# Patient Record
Sex: Male | Born: 1970 | Race: White | Hispanic: No
Health system: Midwestern US, Community
[De-identification: ages and names within clinical notes are randomized; demographics above are authoritative.]

## PROBLEM LIST (undated history)

## (undated) DIAGNOSIS — G2581 Restless legs syndrome: Secondary | ICD-10-CM

## (undated) DIAGNOSIS — N529 Male erectile dysfunction, unspecified: Secondary | ICD-10-CM

## (undated) DIAGNOSIS — I10 Essential (primary) hypertension: Secondary | ICD-10-CM

## (undated) DIAGNOSIS — I4891 Unspecified atrial fibrillation: Secondary | ICD-10-CM

## (undated) DIAGNOSIS — S46911A Strain of unspecified muscle, fascia and tendon at shoulder and upper arm level, right arm, initial encounter: Secondary | ICD-10-CM

## (undated) DIAGNOSIS — M25519 Pain in unspecified shoulder: Secondary | ICD-10-CM

## (undated) DIAGNOSIS — I480 Paroxysmal atrial fibrillation: Principal | ICD-10-CM

## (undated) HISTORY — PX: HERNIA REPAIR: SHX51

## (undated) HISTORY — PX: COLONOSCOPY: SHX174

## (undated) HISTORY — PX: TONSILLECTOMY AND ADENOIDECTOMY: SHX28

---

## 2011-04-30 NOTE — Discharge Instructions (Signed)
Abdominal Pain  Abdominal pain can be caused by many things. Your caregiver decides the seriousness of your pain by an examination and possibly blood tests and X-rays. Many cases can be observed and treated at home. Most abdominal pain is not caused by a disease and will probably improve without treatment. However, in many cases, more time must pass before a clear cause of the pain can be found. Before that point, it may not be known if you need more testing, or if hospitalization or surgery is needed.  HOME CARE INSTRUCTIONS   Do not take laxatives unless directed by your caregiver.    Take pain medicine only as directed by your caregiver.    Only take over-the-counter or prescription medicines for pain, discomfort, or fever as directed by your caregiver.    Try a clear liquid diet (broth, tea, or water) as ordered by your caregiver. Slowly move to a bland diet as tolerated.   SEEK IMMEDIATE MEDICAL CARE IF:   The pain does not go away.    You or your child has an oral temperature above 102 F (38.9 C), not controlled by medicine.    You keep throwing up (vomiting).    The pain is felt only in portions of the abdomen. Pain in the right side could possibly be appendicitis. In an adult, pain in the left lower portion of the abdomen could be colitis or diverticulitis.    You pass bloody or black tarry stools.   MAKE SURE YOU:   Understand these instructions.    Will watch your condition.    Will get help right away if you are not doing well or get worse.   Document Released: 10/12/2005 Document Re-Released: 01/06/2010  [facility] Patient Information 2012 [place].    Gastritis  (Vomiting, Upset Stomach)  Gastritis is an inflammation (the body's way of reacting to injury and/or infection) of the stomach. It is often caused by viral or bacterial (germ) infections. It can also be caused by chemicals (including alcohol) and medications. This illness may be associated with generalized malaise (feeling  tired, not well), cramps, and fever. The illness may last two to seven days. If symptoms of gastritis continue, gastroscopy (looking into the stomach with a telescope-like instrument), biopsy (taking tissue sample), and/or blood tests may be necessary to determine the cause. Antibiotics will not affect the illness unless there is a bacterial infection present. One common bacterial cause of gastritis is an organism known as H. Pylori. This can be treated with antibiotics. Other forms of gastritis are caused by too much acid in the stomach. They can be treated with medications such as H2 blockers and antacids. Home treatment is usually all that is needed. Young children will quickly become dehydrated (loss of body fluids) if vomiting and diarrhea are both present. Medications may be given to control nausea. Medications are usually not given for diarrhea unless especially bothersome. Some medications slow the removal of the virus from the gastrointestinal tract. This slows down the healing process.  HOME CARE INSTRUCTIONS FOR NAUSEA AND VOMITING   For adults: drink small amounts of fluids often. Drink at least 2 quarts a day. Take sips frequently. Do not drink large amounts of fluid at one time. This may worsen the nausea.    Only take over-the-counter or prescription medicines for pain, discomfort, or fever as directed by your caregiver.    Drink clear liquids only. Those are anything you can see through such as water, broth, or soft drinks.  Once you are keeping clear liquids down, you may start full liquids, soups, juices, and ice cream or sherbet. Slowly add bland (plain, not spicy) foods to your diet.   HOME CARE INSTRUCTIONS FOR DIARRHEA  Diarrhea can be caused by bacterial infections or a virus. Your condition should improve with time, rest, fluids, and/or anti-diarrheal medication.  Until your diarrhea is under control, you should drink clear liquids often in small amounts. Clear liquids include: water,  broth, jell-o water and weak tea.    Avoid:   Milk.    Fruits.    Tobacco.    Alcohol.    Extremely hot or cold fluids.    Too much intake of anything at one time.      When your diarrhea stops you may add the following foods which help the stool to become more formed:   Rice    Bananas    Apples without skin    Dry toast   Once these foods are tolerated you may add low-fat yogurt and low-fat cottage cheese. They will help to restore the normal bacterial balance in your bowel.  Wash your hands well to avoid spreading bacteria (germ) or virus.  SEEK IMMEDIATE MEDICAL CARE IF:   You are unable to keep fluids down.    Vomiting or diarrhea become persistent (have again and again).    Abdominal pain develops, or increases, or localizes. (Right sided pain can be appendicitis. Left sided pain in adults can be diverticulitis.)    You develop a high fever (an oral temperature above 102 F (38.9 C), or as your caregiver suggests) for over 3 days.    Diarrhea becomes excessive or contains blood or mucous.    You have excessive weakness, dizziness, fainting or extreme thirst.   MAKE SURE YOU:     Understand these instructions.    Will watch your condition.    Will get help right away if you are not doing well or get worse.   Document Released: 01/31/2003 Document Re-Released: 08/09/2009  [facility] Patient Information 2012 [place].

## 2011-04-30 NOTE — ED Provider Notes (Signed)
HPI Comments: Patient states that he has pain in the epigastric area, worse with palpation or pressure on the abdomen.  He states that he is nauseated but there is no change in appetite and he continues to eat the same amount.  He denies change in bowel movement.  This does not feel similar to his diverticulitis.  He denies alcohol but admits to drinking large amounts of caffeine.    Patient is a 40 y.o. male presenting with abdominal pain. The history is provided by the patient.   Abdominal Pain  The primary symptoms of the illness include abdominal pain and nausea. The primary symptoms of the illness do not include fever, fatigue, shortness of breath, vomiting, diarrhea or dysuria. The current episode started more than 2 days ago. The onset of the illness was gradual. The problem has been gradually worsening.   The patient has not had a change in bowel habit. Symptoms associated with the illness do not include chills, anorexia, diaphoresis, heartburn, constipation, urgency, frequency or back pain. Significant associated medical issues include diverticulitis.       Review of Systems   Constitutional: Negative for fever, chills, diaphoresis, appetite change and fatigue.   HENT: Negative for ear pain, sore throat and sinus pressure.    Eyes: Negative for pain, discharge and redness.   Respiratory: Negative for cough, shortness of breath and wheezing.    Cardiovascular: Negative for chest pain.   Gastrointestinal: Positive for nausea and abdominal pain. Negative for heartburn, vomiting, diarrhea, constipation, blood in stool, abdominal distention and anorexia.   Genitourinary: Negative for dysuria, urgency and frequency.   Musculoskeletal: Negative for back pain and arthralgias.   Skin: Negative for rash and wound.   Neurological: Negative for weakness and headaches.   Hematological: Negative for adenopathy.   [all other systems reviewed and are negative        Physical Exam   [nursing notereviewed.  Constitutional:  He is oriented to person, place, and time. He appears well-developed and well-nourished. No distress.   HENT:   Head: Normocephalic and atraumatic.   Eyes: Pupils are equal, round, and reactive to light.   Neck: Normal range of motion. Neck supple.   Cardiovascular: Normal rate, regular rhythm and normal heart sounds.    No murmur heard.  Pulmonary/Chest: Effort normal and breath sounds normal. No respiratory distress. He has no wheezes. He has no rales.   Abdominal: Soft. Bowel sounds are normal. He exhibits no distension and no mass. There is no tenderness. There is no rebound and no guarding.   Musculoskeletal: He exhibits no edema.   Neurological: He is alert and oriented to person, place, and time. No cranial nerve deficit. Coordination normal.   Skin: Skin is warm and dry. He is not diaphoretic. No pallor.       Procedures    MDM  23:00  Patient sleeping comfortably in the room.  --------------------------------------------- PAST HISTORY ---------------------------------------------  Past Medical History:  has a past medical history of Diverticulitis.    Past Surgical History:  has past surgical history that includes hernia repair (2008); Appendectomy; and Abdomen surgery.    Social History:  reports that he has never smoked. He does not have any smokeless tobacco history on file. He reports that he drinks alcohol. He reports that he does not use illicit drugs.    Family History: family history is not on file.     The patient's home medications have been reviewed.    Allergies: Review of patient's  allergies indicates no known allergies.    -------------------------------------------------- RESULTS -------------------------------------------------  Labs:  Results for orders placed during the hospital encounter of 04/30/11   CBC WITH AUTO DIFFERENTIAL       Component Value Range    WBC 13.5 (*) 4.5 - 11.5 (E9/L)    RBC 5.10  3.80 - 5.80 (E12/L)    Hemoglobin 14.9  12.5 - 16.5 (g/dL)    Hematocrit 16.1  09.6 -  54.0 (%)    MCV 84.4  80.0 - 99.9 (fL)    MCH 29.2  26.0 - 35.0 (pg)    MCHC 34.6 (*) 32.0 - 34.5 (%)    RDW 13.1  11.5 - 15.0 (fL)    Platelets 294  130 - 450 (E9/L)    MPV 7.8  7.0 - 12.0 (fL)    Absolute Neut # 10.99 (*) 1.80 - 7.30 (E9/L)    Absolute Lymph # 1.69  1.50 - 4.00 (E9/L)    Absolute Mono # 0.67  0.10 - 0.95 (E9/L)    Absolute Eos # 0.09  0.05 - 0.50 (E9/L)    Basophils Absolute 0.02  0.00 - 0.20 (E9/L)    Seg Neutrophils 82 (*) 43 - 80 (%)    Lymphocytes 13 (*) 20 - 42 (%)    Monocytes 5  2 - 12 (%)    Eosinophils 1  0 - 6 (%)    Basophils 0  0 - 2 (%)   COMPREHENSIVE METABOLIC PANEL       Component Value Range    Sodium 142  132 - 146 (mmol/L)    Potassium 3.9  3.5 - 5.5 (mmol/L)    Chloride 108  99 - 109 (mmol/L)    CO2 26  20 - 31 (mmol/L)    Glucose 85  70 - 110 (mg/dL)    BUN 12  6 - 20 (mg/dL)    Creatinine, Ser 0.9  0.7 - 1.3 (mg/dL)    Calcium 9.8  8.6 - 10.5 (mg/dL)    Total Protein 7.3  5.7 - 8.2 (g/dL)    Alb 4.4  3.2 - 4.8 (g/dL)    Alkaline Phosphatase 68  45 - 129 (U/L)    AST 30  0 - 33 (U/L)    Total Bilirubin 0.4  0.3 - 1.2 (mg/dL)    ALT 34  10 - 49 (U/L)   LIPASE       Component Value Range    Lipase 29  6 - 51 (U/L)   LACTIC ACID, PLASMA       Component Value Range    Lactic Acid 0.7  0.5 - 2.2 (mmol/L)   GFR CALCULATED       Component Value Range    Gfr Calculated >60  >=60 (ml/mn/1.73)       Radiology:  X-ray Abdomen Ap    04/30/2011  "" Exam Start [id number] Indication- Pain  Findings- AP supine view of the abdomen obtained. Bowel gas pattern  unremarkable. No abnormal intra-abdominal calcification or acute osseous abnormality.  IMPRESSION-  No focal abnormality.        Transcriptionist- [facility]   M.D.      Read ByAlinda Dooms   M.D.      Released By- Alinda Dooms   M.D.      Released Date Time- 04/30/11 2251  ------------------------------------------------------------------------------       ------------------------- NURSING NOTES AND VITALS REVIEWED  ---------------------------  The nursing notes within the ED encounter and  vital signs as below have been reviewed.   BP 113/69  Pulse 68  Temp(Src) 98.4 F (36.9 C) (Oral)  Resp 16  Ht 6' (1.829 m)  Wt 240 lb (108.863 kg)  BMI 32.55 kg/m2  SpO2 95%  Oxygen Saturation Interpretation: Normal      ------------------------------------------ PROGRESS NOTES ------------------------------------------  I have spoken with the patient and discussed today's results, in addition to providing specific details for the plan of care and counseling regarding the diagnosis and prognosis.  Their questions are answered at this time and they are agreeable with the plan.  12:09  Patient feeling better after GI cocktail.  He was encouraged to cut back on caffeine.    --------------------------------- ADDITIONAL PROVIDER NOTES ---------------------------------  At this time the patient is without objective evidence of an acute process requiring hospitalization or inpatient management.  They have remained hemodynamically stable throughout their entire ED visit and are stable for discharge with outpatient follow-up.     The plan has been discussed in detail and they are aware of the specific conditions for emergent return, as well as the importance of follow-up.      New Prescriptions    FAMOTIDINE (PEPCID) 40 MG TABLET    Take 1 tablet by mouth daily.       Diagnosis:  1. Abdominal pain, epigastric        Disposition:  Patient's disposition: Discharge to home  Patient's condition is stable.          Noreene Larsson Babita Amaker, DO  05/01/11 0010

## 2011-05-01 ENCOUNTER — Inpatient Hospital Stay
Admit: 2011-05-01 | Discharge: 2011-05-01 | Disposition: A | Discharge status: Home / Self Care | Attending: Emergency Medicine

## 2011-05-01 LAB — CBC WITH AUTO DIFFERENTIAL
Absolute Eos #: 0.09 E9/L (ref 0.05–0.50)
Absolute Lymph #: 1.69 E9/L (ref 1.50–4.00)
Absolute Mono #: 0.67 E9/L (ref 0.10–0.95)
Absolute Neut #: 10.99 E9/L — ABNORMAL HIGH (ref 1.80–7.30)
Basophils Absolute: 0.02 E9/L (ref 0.00–0.20)
Basophils: 0 % (ref 0–2)
Eosinophils %: 1 % (ref 0–6)
Hematocrit: 43.1 % (ref 37.0–54.0)
Hemoglobin: 14.9 g/dL (ref 12.5–16.5)
Lymphocytes: 13 % — ABNORMAL LOW (ref 20–42)
MCH: 29.2 pg (ref 26.0–35.0)
MCHC: 34.6 % — ABNORMAL HIGH (ref 32.0–34.5)
MCV: 84.4 fL (ref 80.0–99.9)
MPV: 7.8 fL (ref 7.0–12.0)
Monocytes: 5 % (ref 2–12)
Platelets: 294 E9/L (ref 130–450)
RBC: 5.1 E12/L (ref 3.80–5.80)
RDW: 13.1 fL (ref 11.5–15.0)
Seg Neutrophils: 82 % — ABNORMAL HIGH (ref 43–80)
WBC: 13.5 E9/L — ABNORMAL HIGH (ref 4.5–11.5)

## 2011-05-01 LAB — COMPREHENSIVE METABOLIC PANEL
ALT: 34 U/L (ref 10–49)
AST: 30 U/L (ref 0–33)
Albumin: 4.4 g/dL (ref 3.2–4.8)
Alkaline Phosphatase: 68 U/L (ref 45–129)
BUN: 12 mg/dL (ref 6–20)
CO2: 26 mmol/L (ref 20–31)
Calcium: 9.8 mg/dL (ref 8.6–10.5)
Chloride: 108 mmol/L (ref 99–109)
Creatinine: 0.9 mg/dL (ref 0.7–1.3)
Glucose: 85 mg/dL (ref 70–110)
Potassium: 3.9 mmol/L (ref 3.5–5.5)
Sodium: 142 mmol/L (ref 132–146)
Total Bilirubin: 0.4 mg/dL (ref 0.3–1.2)
Total Protein: 7.3 g/dL (ref 5.7–8.2)

## 2011-05-01 LAB — LACTIC ACID: Lactic Acid: 0.7 mmol/L (ref 0.5–2.2)

## 2011-05-01 LAB — LIPASE: Lipase: 29 U/L (ref 6–51)

## 2011-05-01 LAB — GFR CALCULATED: Gfr Calculated: >60 mL/min/{1.73_m2} (ref >=60)

## 2011-05-01 MED ORDER — FAMOTIDINE 40 MG PO TABS
40 MG | ORAL_TABLET | Freq: Every day | ORAL | Status: DC
Start: 2011-05-01 — End: 2012-05-26

## 2011-05-01 MED ADMIN — GI COCKTAIL 1 Bottle: ORAL | @ 04:00:00 | NDC 09999990073

## 2011-05-01 MED ADMIN — ondansetron (ZOFRAN) injection 4 mg: INTRAVENOUS | @ 03:00:00 | NDC 00641607801

## 2011-05-01 MED ADMIN — morphine injection 5 mg: INTRAVENOUS | @ 03:00:00 | NDC 00641607301

## 2011-05-01 MED ADMIN — sodium chloride 0.9% bolus: INTRAVENOUS | @ 03:00:00 | NDC 00338004904

## 2011-05-01 MED ADMIN — ketorolac (TORADOL) injection 30 mg: 30 mg | INTRAVENOUS | @ 03:00:00 | NDC 00409379501

## 2011-05-01 MED FILL — ONDANSETRON HCL 4 MG/2ML IJ SOLN: 4 MG/2ML | INTRAMUSCULAR | Qty: 2

## 2011-05-01 MED FILL — MORPHINE SULFATE 5 MG/ML IJ SOLN: 5 mg/mL | INTRAMUSCULAR | Qty: 1

## 2011-05-01 MED FILL — KETOROLAC TROMETHAMINE 30 MG/ML IJ SOLN: 30 mg/mL | INTRAMUSCULAR | Qty: 1

## 2011-05-01 MED FILL — GI COCKTAIL: Qty: 50

## 2011-05-01 MED FILL — SODIUM CHLORIDE 0.9 % IV SOLN: 0.9 % | INTRAVENOUS | Qty: 1000

## 2011-05-01 MED FILL — NORMAL SALINE FLUSH 0.9 % IJ SOLN: 0.9 % | INTRAMUSCULAR | Qty: 20

## 2011-05-01 NOTE — ED Notes (Signed)
Pt states he Has a responsible adult for a ride home.    Vaughan Basta, RN  05/01/11 [phone]

## 2012-05-26 NOTE — Discharge Instructions (Signed)

## 2012-05-26 NOTE — ED Provider Notes (Signed)
HPI Comments: Patient states this evening while seated on the couch, he began to have abdominal pain and his belly became distended. No nausea, vomiting, fever or chills. He states about time he arrived here his belly seems to have gone down, become soft and he feels much better. He admits to passing gas however, no diarrhea.    Patient is a 41 y.o. male presenting with abdominal pain. The history is provided by the patient.   Abdominal Pain  The primary symptoms of the illness include abdominal pain. The primary symptoms of the illness do not include fever, fatigue, shortness of breath, nausea, vomiting, diarrhea, hematemesis, hematochezia or dysuria. The current episode started 1 to 2 hours ago. The onset of the illness was gradual. The problem has been resolved.   The patient has not had a change in bowel habit. Symptoms associated with the illness do not include chills, anorexia, diaphoresis, heartburn, constipation, urgency, hematuria, frequency or back pain.       Review of Systems   Constitutional: Negative for fever, chills, diaphoresis and fatigue.   HENT: Negative for ear pain, sore throat and sinus pressure.    Eyes: Negative for pain, discharge and redness.   Respiratory: Negative for cough, shortness of breath and wheezing.    Cardiovascular: Negative for chest pain.   Gastrointestinal: Positive for abdominal pain and abdominal distention. Negative for heartburn, nausea, vomiting, diarrhea, constipation, hematochezia, anorexia and hematemesis.   Genitourinary: Negative for dysuria, urgency, frequency and hematuria.   Musculoskeletal: Negative for back pain and arthralgias.   Skin: Negative for rash and wound.   Neurological: Negative for weakness and headaches.   Hematological: Negative for adenopathy.   All other systems reviewed and are negative.        Physical Exam   Nursing note and vitals reviewed.  Constitutional: He is oriented to person, place, and time. He appears well-developed and  well-nourished.   HENT:   Head: Normocephalic and atraumatic.   Eyes: Pupils are equal, round, and reactive to light.   Neck: Normal range of motion. Neck supple.   Cardiovascular: Normal rate, regular rhythm and normal heart sounds.    No murmur heard.  Pulmonary/Chest: Effort normal and breath sounds normal. No respiratory distress. He has no wheezes. He has no rales.   Abdominal: Soft. Bowel sounds are normal. He exhibits no distension. There is no tenderness. There is no rebound and no guarding.   Patient states his symptoms have resolved, no distention nor pain to palpation. Bowel sounds are normal.   Musculoskeletal: He exhibits no edema.   Neurological: He is alert and oriented to person, place, and time. No cranial nerve deficit. Coordination normal.   Skin: Skin is warm and dry.       Procedures    MDM    --------------------------------------------- PAST HISTORY ---------------------------------------------  Past Medical History:  has a past medical history of Diverticulitis and Hernia.    Past Surgical History:  has past surgical history that includes hernia repair (2008); Appendectomy; and Abdomen surgery.    Social History:  reports that he has never smoked. He does not have any smokeless tobacco history on file. He reports that he does not drink alcohol or use illicit drugs.    Family History: family history is not on file.     The patient's home medications have been reviewed.    Allergies: Review of patient's allergies indicates no known allergies.    -------------------------------------------------- RESULTS -------------------------------------------------  Labs:  Results for orders placed  during the hospital encounter of 05/26/12   CBC WITH AUTO DIFFERENTIAL       Result Value Range    WBC 14.6 (*) 4.5 - 11.5 E9/L    RBC 5.32  3.80 - 5.80 E12/L    Hemoglobin 15.1  12.5 - 16.5 g/dL    Hematocrit 10.9  32.3 - 54.0 %    MCV 84.7  80.0 - 99.9 fL    MCH 28.4  26.0 - 35.0 pg    MCHC 33.5  32.0 - 34.5 %     RDW 13.7  11.5 - 15.0 fL    Platelets 301  130 - 450 E9/L    MPV 8.3  7.0 - 12.0 fL    Absolute Neut # 11.97 (*) 1.80 - 7.30 E9/L    Absolute Lymph # 1.51  1.50 - 4.00 E9/L    Absolute Mono # 0.93  0.10 - 0.95 E9/L    Absolute Eos # 0.19  0.05 - 0.50 E9/L    Basophils Absolute 0.03  0.00 - 0.20 E9/L    Seg Neutrophils 82 (*) 43 - 80 %    Lymphocytes 10 (*) 20 - 42 %    Monocytes 6  2 - 12 %    Eosinophils 1  0 - 6 %    Basophils 0  0 - 2 %   COMPREHENSIVE METABOLIC PANEL       Result Value Range    Sodium 143  132 - 146 mmol/L    Potassium 3.7  3.5 - 5.5 mmol/L    Chloride 108  99 - 109 mmol/L    CO2 28  20 - 31 mmol/L    Glucose 79  70 - 110 mg/dL    BUN 14  6 - 20 mg/dL    Creatinine, Ser 1.0  0.7 - 1.3 mg/dL    Calcium 55.7  8.6 - 10.5 mg/dL    Total Protein 7.4  5.7 - 8.2 g/dL    Alb 4.7  3.2 - 4.8 g/dL    Alkaline Phosphatase 69  45 - 129 U/L    AST 19  0 - 33 U/L    Total Bilirubin 0.6  0.3 - 1.2 mg/dL    ALT 32  10 - 49 U/L   LIPASE       Result Value Range    Lipase 33  6 - 51 U/L   LACTIC ACID, PLASMA       Result Value Range    Lactic Acid 0.8  0.5 - 2.2 mmol/L   URINALYSIS WITH MICROSCOPIC       Result Value Range    Color, UA YELLOW  YELLOW    Clarity, UA CLEAR  CLEAR    Glucose, Ur NEGATIVE  NEGATIVE mg/dL    Bilirubin Urine NEGATIVE  NEGATIVE    Ketones, Urine 15 (*) NEGATIVE mg/dL    Specific Gravity, UA 1.025  <=1.035    Blood, Urine SMALL (*) NEGATIVE    pH, UA 6.0  5.0 - 8.5    Protein, UA NEGATIVE  NEGATIVE mg/dL    Urobilinogen, Urine 0.2  0.2 - 1.0 E.U./dL    Nitrite, Urine NEGATIVE  NEGATIVE    Leukocyte Esterase, Urine NEGATIVE  NEGATIVE    WBC, UA 0-1  <5 /hpf    RBC, UA 0-1  <2 /hpf    Bacteria, UA NONE  NONE /hpf    Squam Epithel, UA RARE     GFR CALCULATED  Result Value Range    Gfr Calculated >60  >=60 ml/mn/1.73       Radiology:  Xr Acute Abd Series Chest 1 Vw    05/26/2012  "" Exam Start [id number] Clinical statement- Abdominal pain  A frontal view of the chest reveals clear  lungs and a normal heart and mediastinum. Supine and upright views of the abdomen reveal no free air or obstruction. Fluid levels are seen throughout the colon but it is not distended. Postsurgical changes are seen on the left which are probably in the colon. There may be a significant amount of residual stool in the area of the sutures.  Impression- 1. Normal frontal chest. 2. No free air or obstruction. 3. Fluid levels with fecal material in the colon. Constipation may be an issue        Transcriptionist- [facility]   M.D.      Read ByIhor Dow   M.D.      Released By- Ihor Dow   M.D.      Released Date Time- 05/26/12 2239  ------------------------------------------------------------------------------       ------------------------- NURSING NOTES AND VITALS REVIEWED ---------------------------  The nursing notes within the ED encounter and vital signs as below have been reviewed.   BP 119/70  Pulse 82  Temp(Src) 98.4 F (36.9 C) (Oral)  Resp 18  Ht 6' (1.829 m)  Wt 240 lb (108.863 kg)  BMI 32.54 kg/m2  SpO2 99%  Oxygen Saturation Interpretation: Normal      ------------------------------------------ PROGRESS NOTES ------------------------------------------  I have spoken with the patient and discussed today's results, in addition to providing specific details for the plan of care and counseling regarding the diagnosis and prognosis.  Their questions are answered at this time and they are agreeable with the plan.    11:05 PM  Discussed results. Patient states when he gave [place] urine sample he also had a large bowel movement consisting of diarrhea and gas. He understands need to followup with primary care physician and will return here for any problems.  --------------------------------- ADDITIONAL PROVIDER NOTES ---------------------------------  At this time the patient is without objective evidence of an acute process requiring hospitalization or inpatient management.  They have remained  hemodynamically stable throughout their entire ED visit and are stable for discharge with outpatient follow-up.     The plan has been discussed in detail and they are aware of the specific conditions for emergent return, as well as the importance of follow-up.      New Prescriptions    No medications on file       Diagnosis:  1. Abdominal pain        Disposition:  Patient's disposition: Discharge to home  Patient's condition is stable.          Lajoyce Corners, DO  05/26/12 2308

## 2012-05-27 ENCOUNTER — Inpatient Hospital Stay
Admit: 2012-05-27 | Discharge: 2012-05-27 | Disposition: A | Discharge status: Home / Self Care | Attending: Emergency Medicine

## 2012-05-27 LAB — URINALYSIS WITH MICROSCOPIC
Bilirubin Urine: NEGATIVE
Glucose, Ur: NEGATIVE mg/dL
Ketones, Urine: 15 mg/dL — AB
Leukocyte Esterase, Urine: NEGATIVE
Nitrite, Urine: NEGATIVE
Protein, UA: NEGATIVE mg/dL
Specific Gravity, UA: 1.025 (ref <=1.035)
Urobilinogen, Urine: 0.2 E.U./dL (ref 0.2–1.0)
pH, UA: 6 (ref 5.0–8.5)

## 2012-05-27 LAB — COMPREHENSIVE METABOLIC PANEL
ALT: 32 U/L (ref 10–49)
AST: 19 U/L (ref 0–33)
Albumin: 4.7 g/dL (ref 3.2–4.8)
Alkaline Phosphatase: 69 U/L (ref 45–129)
BUN: 14 mg/dL (ref 6–20)
CO2: 28 mmol/L (ref 20–31)
Calcium: 10 mg/dL (ref 8.6–10.5)
Chloride: 108 mmol/L (ref 99–109)
Creatinine: 1 mg/dL (ref 0.7–1.3)
Glucose: 79 mg/dL (ref 70–110)
Potassium: 3.7 mmol/L (ref 3.5–5.5)
Sodium: 143 mmol/L (ref 132–146)
Total Bilirubin: 0.6 mg/dL (ref 0.3–1.2)
Total Protein: 7.4 g/dL (ref 5.7–8.2)

## 2012-05-27 LAB — CBC WITH AUTO DIFFERENTIAL
Absolute Eos #: 0.19 E9/L (ref 0.05–0.50)
Absolute Lymph #: 1.51 E9/L (ref 1.50–4.00)
Absolute Mono #: 0.93 E9/L (ref 0.10–0.95)
Absolute Neut #: 11.97 E9/L — ABNORMAL HIGH (ref 1.80–7.30)
Basophils Absolute: 0.03 E9/L (ref 0.00–0.20)
Basophils: 0 % (ref 0–2)
Eosinophils %: 1 % (ref 0–6)
Hematocrit: 45.1 % (ref 37.0–54.0)
Hemoglobin: 15.1 g/dL (ref 12.5–16.5)
Lymphocytes: 10 % — ABNORMAL LOW (ref 20–42)
MCH: 28.4 pg (ref 26.0–35.0)
MCHC: 33.5 % (ref 32.0–34.5)
MCV: 84.7 fL (ref 80.0–99.9)
MPV: 8.3 fL (ref 7.0–12.0)
Monocytes: 6 % (ref 2–12)
Platelets: 301 E9/L (ref 130–450)
RBC: 5.32 E12/L (ref 3.80–5.80)
RDW: 13.7 fL (ref 11.5–15.0)
Seg Neutrophils: 82 % — ABNORMAL HIGH (ref 43–80)
WBC: 14.6 E9/L — ABNORMAL HIGH (ref 4.5–11.5)

## 2012-05-27 LAB — LACTIC ACID: Lactic Acid: 0.8 mmol/L (ref 0.5–2.2)

## 2012-05-27 LAB — GFR CALCULATED: Gfr Calculated: >60 mL/min/{1.73_m2} (ref >=60)

## 2012-05-27 LAB — LIPASE: Lipase: 33 U/L (ref 6–51)

## 2014-02-12 ENCOUNTER — Inpatient Hospital Stay: Admit: 2014-02-12 | Discharge: 2014-02-12 | Disposition: A | Discharge status: Home / Self Care

## 2014-02-12 MED ORDER — CYCLOBENZAPRINE HCL 10 MG PO TABS
10 MG | ORAL_TABLET | Freq: Three times a day (TID) | ORAL | Status: AC | PRN
Start: 2014-02-12 — End: 2014-02-17

## 2014-02-12 MED ORDER — IBUPROFEN 800 MG PO TABS
800 MG | ORAL_TABLET | Freq: Four times a day (QID) | ORAL | Status: DC | PRN
Start: 2014-02-12 — End: 2015-07-18

## 2014-02-12 MED ORDER — HYDROCODONE-ACETAMINOPHEN 5-325 MG PO TABS
5-325 MG | ORAL_TABLET | Freq: Four times a day (QID) | ORAL | Status: AC | PRN
Start: 2014-02-12 — End: 2014-02-15

## 2014-02-12 MED ADMIN — cyclobenzaprine (FLEXERIL) tablet 10 mg: 10 mg | ORAL | @ 22:00:00 | NDC 68084039711

## 2014-02-12 MED ADMIN — oxyCODONE-acetaminophen (PERCOCET) 5-325 MG per tablet 1 tablet: 1 | ORAL | @ 22:00:00 | NDC 00406051223

## 2014-02-12 MED FILL — OXYCODONE-ACETAMINOPHEN 5-325 MG PO TABS: 5-325 mg | ORAL | Qty: 1

## 2014-02-12 MED FILL — CYCLOBENZAPRINE HCL 10 MG PO TABS: 10 mg | ORAL | Qty: 1

## 2014-02-12 NOTE — Discharge Instructions (Signed)
Back Strain: After Your Visit  Your Care Instructions     Back strain happens when you overstretch, or pull, a muscle in your back. You may hurt your back in an accident or when you exercise or lift something.  Most back pain will get better with rest and time. You can take care of yourself at home to help your back heal.  Follow-up care is a key part of your treatment and safety. Be sure to make and go to all appointments, and call your doctor if you are having problems. It's also a good idea to know your test results and keep a list of the medicines you take.  How can you care for yourself at home?   Try to stay as active as you can, but stop or reduce any activity that causes pain.   Put ice or a cold pack on the sore muscle for 10 to 20 minutes at a time to stop swelling. Try this every 1 to 2 hours for 3 days (when you are awake) or until the swelling goes down. Put a thin cloth between the ice pack and your skin.   After 2 or 3 days, apply a heating pad on low or a warm cloth to your back. Some doctors suggest that you go back and forth between hot and cold treatments.   Take pain medicines exactly as directed.   If the doctor gave you a prescription medicine for pain, take it as prescribed.   If you are not taking a prescription pain medicine, ask your doctor if you can take an over-the-counter medicine.   Try sleeping on your side with a pillow between your legs. Or put a pillow under your knees when you lie on your back. These measures can ease pain in your lower back.   Return to your usual level of activity slowly.  When should you call for help?  Call 911 anytime you think you may need emergency care. For example, call if:   You suddenly cannot walk or stand.   You have sudden numbness or weakness in both legs.  Call your doctor now or seek immediate medical care if:   You have a new loss of bowel or bladder control.   Your pain is worse.   You have new pain, numbness, tingling, or  weakness, especially in the buttocks, genital or rectal area, legs, or feet.   You have symptoms of a urinary infection. For example:   You have blood or pus in your urine.   You have pain in your back just below your rib cage. This is called flank pain.   You have a fever, chills, or body aches.   It hurts to urinate.   You have groin or belly pain.  Watch closely for changes in your health, and be sure to contact your doctor if:   Your pain and swelling do not start to get better after 2 days of home treatment.   Where can you learn more?   Go to [email or web address] and sign in to your MyChart account. Enter U095 in the Search Health Information box to learn more about "Back Strain: After Your Visit."    If you do not have an account, please click on the "Sign Up Now" link.      2006-2015 [employer]. Care instructions adapted under license by [facility]. This care instruction is for use with your licensed healthcare professional. If you have questions about a medical condition or this   instruction, always ask your healthcare professional. Healthwise, Incorporated disclaims any warranty or liability for your use of this information.  Content Version: 10.4.390249; Current as of: September 08, 2013

## 2014-02-12 NOTE — ED Provider Notes (Signed)
Independent [facility]LP    .HPI:  02/12/2014, Time: 5:28 PM         Jenetta LogesKenneth J Daniel is a 43 y.o. male presenting to the ED for  right posterior thoracic pain beginning 1 week ago.  The complaint has been intermittent, moderate in severity, and worsened by deep breath, cough. Patient states approximately 1 week ago he was pushing a car into a garage complaining of right posterior thoracic pain. Review of Systems:   Pertinent positives and negatives are stated within HPI, all other systems reviewed and are negative.          --------------------------------------------- PAST HISTORY ---------------------------------------------  Past Medical History:  has a past medical history of Diverticulitis and Hernia.    Past Surgical History:  has past surgical history that includes hernia repair (2008); Appendectomy; and Abdomen surgery.    Social History:  reports that he has never smoked. He does not have any smokeless tobacco history on file. He reports that he does not drink alcohol or use illicit drugs.    Family History: family history is not on file.     The patient???s home medications have been reviewed.    Allergies: Review of patient's allergies indicates no known allergies.    -------------------------------------------------- RESULTS -------------------------------------------------  All laboratory and radiology results have been personally reviewed by myself   LABS:  No results found for this visit on 02/12/14.    RADIOLOGY:  Interpreted by Radiologist.  XR CHEST STANDARD TWO VW    Final Result: IMPRESSION:          1. No acute cardiopulmonary process.            ------------------------- NURSING NOTES AND VITALS REVIEWED ---------------------------   The nursing notes within the ED encounter and vital signs as below have been reviewed.   BP 142/88    Pulse 90    Temp(Src) 98.6 ??F (37 ??C) (Oral)    Resp 20    Ht 6' (1.829 m)    Wt 260 lb (117.935 kg)    BMI 35.25 kg/m2      SpO2 95%   Oxygen Saturation Interpretation:  Normal      ---------------------------------------------------PHYSICAL EXAM--------------------------------------      Constitutional/General: Alert and oriented x3, well appearing, non toxic in NAD  Head: Normocephalic and atraumatic  Eyes: PERRL, EOMI  Mouth: Oropharynx clear, handling secretions, no trismus  Neck: Supple, full ROM, no vertebral point tenderness  Pulmonary: Lungs clear to auscultation bilaterally, no wheezes, rales, or rhonchi. Not in respiratory distress  Cardiovascular:  Regular rate and rhythm, no murmurs, gallops, or rubs. 2+ distal pulses  Abdomen: Soft, non tender, non distended,   Back no vertebral point tenderness tender over the right thoracic posterior paravertebral muscle area.  Extremities: Moves all extremities x 4. Warm and well perfused  Skin: warm and dry without rash  Neurologic: GCS 15,  Psych: Normal Affect      ------------------------------ ED COURSE/MEDICAL DECISION MAKING----------------------  Medications   oxyCODONE-acetaminophen (PERCOCET) 5-325 MG per tablet 1 tablet (1 tablet Oral Given 02/12/14 1751)   cyclobenzaprine (FLEXERIL) tablet 10 mg (10 mg Oral Given 02/12/14 1751)         Medical Decision Making:    We'll discharge with Norco and Flexeril and Motrin patient to follow-up with primary care    Counseling:   The emergency provider has spoken with the patient and discussed today???s results, in addition to providing specific details for the plan of care and counseling regarding the diagnosis  and prognosis.  Questions are answered at this time and they are agreeable with the plan.      --------------------------------- IMPRESSION AND DISPOSITION ---------------------------------    IMPRESSION  1. Thoracic myofascial strain        DISPOSITION  Disposition: Discharge to home  Patient condition is good      NOTE: This report was transcribed using voice recognition software. Every effort was made to ensure accuracy; however, inadvertent computerized transcription errors  may be present          Griffin BasilRenee M Cameren Odwyer, [place]  02/12/14 1836

## 2014-03-29 ENCOUNTER — Encounter

## 2014-05-24 ENCOUNTER — Inpatient Hospital Stay
Admit: 2014-05-24 | Discharge: 2014-05-24 | Disposition: A | Discharge status: Home / Self Care | Attending: Emergency Medicine

## 2014-05-24 MED ORDER — TOBRAMYCIN 0.3 % OP SOLN
0.3 % | Freq: Four times a day (QID) | OPHTHALMIC | Status: DC
Start: 2014-05-24 — End: 2014-05-25

## 2014-05-24 NOTE — Progress Notes (Signed)
Eye stream and tetracaine given to Dr. Loann Quill per his request.

## 2014-05-24 NOTE — Discharge Instructions (Signed)
Pinkeye: After Your Visit  Your Care Instructions     Pinkeye is redness and swelling of the eye surface and the conjunctiva (the lining of the eyelid and the covering of the white part of the eye). Pinkeye is also called conjunctivitis. Pinkeye is often caused by infection with bacteria or a virus. Dry air, allergies, smoke, and chemicals are other common causes.  Pinkeye often clears on its own in 7 to 10 days. Antibiotics only help if the pinkeye is caused by bacteria. Pinkeye caused by infection spreads easily. If an allergy or chemical is causing pinkeye, it will not go away unless you can avoid whatever is causing it.  Follow-up care is a key part of your treatment and safety. Be sure to make and go to all appointments, and call your doctor if you are having problems. It's also a good idea to know your test results and keep a list of the medicines you take.  How can you care for yourself at home?   Wash your hands often. Always wash them before and after you treat pinkeye or touch your eyes or face.   Use moist cotton or a clean, wet cloth to remove crust. Wipe from the inside corner of the eye to the outside. Use a clean part of the cloth for each wipe.   Put cold or warm wet cloths on your eye a few times a day if the eye hurts.   Do not wear contact lenses or eye makeup until the pinkeye is gone. Throw away any eye makeup you were using when you got pinkeye. Clean your contacts and storage case. If you wear disposable contacts, use a new pair when your eye has cleared and it is safe to wear contacts again.   If the doctor gave you antibiotic ointment or eyedrops, use them as directed. Use the medicine for as long as instructed, even if your eye starts looking better soon. Keep the bottle tip clean, and do not let it touch the eye area.   To put in eyedrops or ointment:   Tilt your head back, and pull your lower eyelid down with one finger.   Drop or squirt the medicine inside the lower  lid.   Close your eye for 30 to 60 seconds to let the drops or ointment move around.   Do not touch the ointment or dropper tip to your eyelashes or any other surface.   Do not share towels, pillows, or washcloths while you have pinkeye.  When should you call for help?  Call your doctor now or seek immediate medical care if:   You have pain in your eye, not just irritation on the surface.   You have a change in vision or loss of vision.   You have an increase in discharge from the eye.   Your eye has not started to improve or begins to get worse within 48 hours after you start using antibiotics.   Pinkeye lasts longer than 7 days.  Watch closely for changes in your health, and be sure to contact your doctor if you have any problems.   Where can you learn more?   Go to [email or web address] and sign in to your MyChart account. Enter Y392 in the Search Health Information box to learn more about "Pinkeye: After Your Visit."    If you do not have an account, please click on the "Sign Up Now" link.      2006-2015 [employer]wise, [employer]. Care instructions adapted under   license by [facility]. This care instruction is for use with your licensed healthcare professional. If you have questions about a medical condition or this instruction, always ask your healthcare professional. Health[employer], Inc[employer] disclaims any warranty or liability for your use of this information.  Content Version: 10.5.422740; Current as of: September 08, 2013

## 2014-05-24 NOTE — Progress Notes (Signed)
Visual acuity 20/10 BILATERALLY.

## 2014-05-24 NOTE — ED Provider Notes (Signed)
Chief complaint:  Right eye irritation    The history is provided by the patient.   Patient complaining of scratchiness and wateriness from the right eye with slight redness. No generalized pain and no loss of vision. No headache. No nausea or vomiting. No trauma to the eye. No contact lens use. No known foreign bodies. No treatment prior to arrival, symptoms gradually worsened throughout today. No periorbital swelling. No pain with range of motion, no photophobia.    Review of Systems   Constitutional: Negative for fever, chills, diaphoresis and fatigue.   HENT: Negative for congestion, ear pain, facial swelling, sinus pressure and sore throat.    Eyes: Positive for discharge, redness and itching. Negative for photophobia, pain and visual disturbance.   Respiratory: Negative for cough, chest tightness, shortness of breath and wheezing.    Cardiovascular: Negative for chest pain.   Gastrointestinal: Negative for nausea, vomiting, abdominal pain and diarrhea.   Genitourinary: Negative for dysuria and frequency.   Musculoskeletal: Negative for back pain and arthralgias.   Skin: Negative for rash and wound.   Neurological: Negative for weakness and headaches.   Hematological: Negative for adenopathy.   All other systems reviewed and are negative.      Physical Exam   Constitutional: He is oriented to person, place, and time. He appears well-developed and well-nourished.  Non-toxic appearance. He does not have a sickly appearance. He does not appear ill. No distress.   HENT:   Head: Normocephalic and atraumatic. Head is without right periorbital erythema and without left periorbital erythema.   No facial or periorbital swelling   Eyes: EOM are normal. Pupils are equal, round, and reactive to light. Right conjunctiva is injected. Right conjunctiva has no hemorrhage. No scleral icterus.   Slit lamp exam:       The right eye shows no fluorescein uptake.   Right eye with slight injection with watering. No purulent drainage.  Extraocular muscles are intact and nontender. Pupils are equal, round and reactive to light. No hyphema. No temporal artery tenderness.  Patient with general right eye injection with watery drainage, under magnification and with floor seen there is no uptake and no foreign bodies noted with no abrasions.   Neck: Normal range of motion. Neck supple.   Cardiovascular: Normal rate, regular rhythm and normal heart sounds.    No murmur heard.  Pulmonary/Chest: Effort normal and breath sounds normal. No respiratory distress. He has no wheezes. He has no rales. He exhibits no tenderness.   Abdominal: Soft. Bowel sounds are normal. There is no tenderness. There is no rebound and no guarding.   Musculoskeletal: He exhibits no edema or tenderness.   Neurological: He is alert and oriented to person, place, and time. He displays normal reflexes. No cranial nerve deficit. He exhibits normal muscle tone. Coordination normal.   Skin: Skin is warm and dry. He is not diaphoretic.   Nursing note and vitals reviewed.      Procedures    MDM    --------------------------------------------- PAST HISTORY ---------------------------------------------  Past Medical History:  has a past medical history of Diverticulitis and Hernia.    Past Surgical History:  has past surgical history that includes hernia repair (2008); Appendectomy; and Abdomen surgery.    Social History:  reports that he has never smoked. He does not have any smokeless tobacco history on file. He reports that he does not drink alcohol or use illicit drugs.    Family History: family history is not on file.  The patient???s home medications have been reviewed.    Allergies: Review of patient's allergies indicates no known allergies.    -------------------------------------------------- RESULTS -------------------------------------------------  Labs:  No results found for this visit on 05/24/14.    Radiology:  No results found.    ------------------------- NURSING NOTES AND  VITALS REVIEWED ---------------------------  Date / Time Roomed:  05/24/2014 12:32 AM  ED Bed Assignment:  17/17    The nursing notes within the ED encounter and vital signs as below have been reviewed.   BP 136/89    Pulse 86    Temp(Src) 98.2 ??F (36.8 ??C) (Oral)    Resp 20    Ht 6' (1.829 m)    Wt 260 lb (117.935 kg)    BMI 35.25 kg/m2      SpO2 97%   Oxygen Saturation Interpretation: Normal      ------------------------------------------ PROGRESS NOTES ------------------------------------------  I have spoken with the patient and discussed today???s results, in addition to providing specific details for the plan of care and counseling regarding the diagnosis and prognosis.  Their questions are answered at this time and they are agreeable with the plan. I discussed at length with them reasons for immediate return here for re evaluation. They will followup with primary care by calling their office tomorrow.      --------------------------------- ADDITIONAL PROVIDER NOTES ---------------------------------  At this time the patient is without objective evidence of an acute process requiring hospitalization or inpatient management.  They have remained hemodynamically stable throughout their entire ED visit and are stable for discharge with outpatient follow-up.     The plan has been discussed in detail and they are aware of the specific conditions for emergent return, as well as the importance of follow-up.      New Prescriptions    TOBRAMYCIN (TOBREX) 0.3 % OPHTHALMIC SOLUTION    Place 1 drop into the right eye every 6 hours for 5 days       Diagnosis:  1. Conjunctivitis        Disposition:  Patient's disposition: Discharge to home  Patient's condition is stable.            Suzette BattiestNicholas J Gopal Malter, DO  05/24/14 [phone]

## 2014-05-25 ENCOUNTER — Inpatient Hospital Stay
Admit: 2014-05-25 | Discharge: 2014-05-25 | Disposition: A | Discharge status: Home / Self Care | Attending: General Practice

## 2014-05-25 MED ORDER — NEOMYCIN-POLYMYXIN-DEXAMETH 0.1 % OP SUSP
0.1 % | Freq: Three times a day (TID) | OPHTHALMIC | Status: AC
Start: 2014-05-25 — End: 2014-06-04

## 2014-05-25 MED ADMIN — fluorescein ophthalmic strip 1 mg: 1 | OPHTHALMIC | @ 22:00:00 | NDC 17478040401

## 2014-05-25 MED FILL — BIO GLO 1 MG OP STRP: 1 mg | OPHTHALMIC | Qty: 1

## 2014-05-25 NOTE — ED Provider Notes (Signed)
The history is provided by the patient.   The Patient states was seen in the ER on Wednesday for right eye drainage and redness.  Was treated with tobramycin states eye is no better and cannot get into eye doctor until Monday.  No change in vision.    Review of Systems   Constitutional: Negative for fever and chills.   HENT: Negative for ear pain, sinus pressure and sore throat.         Eye drainage and redness   Eyes: Negative for pain, discharge and redness.   Respiratory: Negative for cough, shortness of breath and wheezing.    Cardiovascular: Negative for chest pain.   Gastrointestinal: Negative for nausea, vomiting, abdominal pain and diarrhea.   Genitourinary: Negative for dysuria and frequency.   Musculoskeletal: Negative for back pain and arthralgias.   Skin: Negative for rash and wound.   Neurological: Negative for weakness and headaches.   Hematological: Negative for adenopathy.   All other systems reviewed and are negative.      Physical Exam   Constitutional: He is oriented to person, place, and time. He appears well-developed and well-nourished.   HENT:   Head: Normocephalic and atraumatic.   Right Ear: Hearing and external ear normal.   Left Ear: Hearing and external ear normal.   Nose: Nose normal. Right sinus exhibits no maxillary sinus tenderness and no frontal sinus tenderness. Left sinus exhibits no maxillary sinus tenderness and no frontal sinus tenderness.   Mouth/Throat: Uvula is midline, oropharynx is clear and moist and mucous membranes are normal. No trismus in the jaw. No uvula swelling.   Eyes: EOM are normal. Pupils are equal, round, and reactive to light. Lids are everted and swept, no foreign bodies found. Right eye exhibits discharge. Right conjunctiva is injected.   Slit lamp exam:       The right eye shows no fluorescein uptake.   Neck: Normal range of motion. Neck supple.   Cardiovascular: Normal rate, regular rhythm and normal heart sounds.    No murmur heard.  Pulmonary/Chest:  Effort normal and breath sounds normal.   Abdominal: Soft. Bowel sounds are normal. There is no tenderness. There is no rigidity, no rebound, no guarding and no CVA tenderness.   Musculoskeletal: He exhibits no edema.   Neurological: He is alert and oriented to person, place, and time. He has normal strength. No cranial nerve deficit or sensory deficit. Coordination and gait normal. GCS eye subscore is 4. GCS verbal subscore is 5. GCS motor subscore is 6.   Skin: Skin is warm and dry. No abrasion and no rash noted.   Nursing note and vitals reviewed.      Procedures    MDM    Labs      Radiology      EKG Interpretation.    --------------------------------------------- PAST HISTORY ---------------------------------------------  Past Medical History:  has a past medical history of Diverticulitis and Hernia.    Past Surgical History:  has past surgical history that includes hernia repair (2008); Appendectomy; and Abdomen surgery.    Social History:  reports that he has never smoked. He does not have any smokeless tobacco history on file. He reports that he does not drink alcohol or use illicit drugs.    Family History: family history is not on file.     The patient???s home medications have been reviewed.    Allergies: Review of patient's allergies indicates no known allergies.    -------------------------------------------------- RESULTS -------------------------------------------------  No results  found for this visit on 05/25/14.       ------------------------- NURSING NOTES AND VITALS REVIEWED ---------------------------   The nursing notes within the ED encounter and vital signs as below have been reviewed.   BP 136/78    Pulse 98    Temp(Src) 98.1 ??F (36.7 ??C) (Oral)    Resp 20    Wt 260 lb (117.935 kg)    SpO2 97%   Oxygen Saturation Interpretation: Normal      ------------------------------------------ PROGRESS NOTES ------------------------------------------   I have spoken with the patient and discussed today???s  results, in addition to providing specific details for the plan of care and counseling regarding the diagnosis and prognosis.  Their questions are answered at this time and they are agreeable with the plan.      --------------------------------- ADDITIONAL PROVIDER NOTES ---------------------------------          This patient is stable for discharge.  I have shared the specific conditions for return, as well as the importance of follow-up.      --------------------------------- IMPRESSION AND DISPOSITION ---------------------------------    IMPRESSION  1. Scleritis        DISPOSITION  Disposition: Discharge to home  Patient condition is good      Marcene DuosJennifer A Modest Draeger, DO  05/25/14 1842

## 2014-09-14 ENCOUNTER — Encounter: Payer: BLUE CROSS/BLUE SHIELD | Attending: Family Medicine | Primary: Family Medicine

## 2014-09-16 NOTE — ED Provider Notes (Addendum)
HPI Comments: Patient is a 43 year old male presenting with chief complaint of epigastric abdominal pain and vomiting. Patient states that he started having some mid abdominal pain and cramping after a movie last evening. Patient states that today however he started have significantly worsening mid abdominal pain as well as feelings of distention and 12 episodes of vomiting and severe nausea. Patient states a significant past history of multiple abdominal surgeries and hernia repairs and a large bowel resection secondary to diverticulitis. Exacerbated by attempting to eat or drink anything, relieved by nothing at this point. Associated symptoms include thirst, nausea, vomiting. Patient denies dysuria, hematuria, flank pain, urgency, frequency, diarrhea, constipation, blood in the stool or dark tarry stools, chest pain, palpitations, lightheadedness.    The history is provided by the patient.       Review of Systems   Constitutional: Positive for chills. Negative for fever, diaphoresis and fatigue.   Respiratory: Negative for cough, chest tightness, shortness of breath and wheezing.    Cardiovascular: Negative for chest pain, palpitations and leg swelling.   Gastrointestinal: Positive for nausea, vomiting, abdominal pain and abdominal distention. Negative for diarrhea and constipation.   Genitourinary: Negative for dysuria, urgency, frequency, hematuria and flank pain.   Skin: Negative for color change, pallor, rash and wound.   Neurological: Negative for dizziness, syncope, weakness, light-headedness, numbness and headaches.   Psychiatric/Behavioral: Negative for confusion and decreased concentration.   All other systems reviewed and are negative.      Physical Exam   Constitutional: He is oriented to person, place, and time. He appears well-developed and well-nourished.  Non-toxic appearance. No distress.   HENT:   Head: Normocephalic and atraumatic.   Right Ear: Hearing and external ear normal.   Left Ear:  Hearing and external ear normal.   Nose: Nose normal.   Mouth/Throat: Uvula is midline, oropharynx is clear and moist and mucous membranes are normal. Mucous membranes are not pale and not dry.   Eyes: Conjunctivae and EOM are normal. Pupils are equal, round, and reactive to light.   Neck: Normal range of motion. Neck supple. Carotid bruit is not present.   Cardiovascular: Normal rate, regular rhythm, S1 normal, S2 normal, normal heart sounds and intact distal pulses.    No murmur heard.  Pulses:       Radial pulses are 2+ on the right side, and 2+ on the left side.        Dorsalis pedis pulses are 2+ on the right side, and 2+ on the left side.   Pulmonary/Chest: Effort normal and breath sounds normal. No respiratory distress. He has no decreased breath sounds. He has no wheezes. He has no rhonchi. He has no rales. He exhibits no tenderness.   Abdominal: Soft. Bowel sounds are normal. He exhibits distension. There is tenderness (diffuse tenderness, worse peri-umbilically). There is guarding. There is no rebound.   Musculoskeletal: Normal range of motion. He exhibits no edema.   Lymphadenopathy:   No pretibial edema or pain.   Neurological: He is alert and oriented to person, place, and time. He has normal strength.   Skin: Skin is warm, dry and intact. No rash noted. He is not diaphoretic. No erythema.   Psychiatric: He has a normal mood and affect. His speech is normal and behavior is normal.   Nursing note and vitals reviewed.      Procedures    MDM    --------------------------------------------- PAST HISTORY ---------------------------------------------  Past Medical History:  has a past medical history  of Diverticulitis and Hernia.    Past Surgical History:  has past surgical history that includes hernia repair (2008); Appendectomy; and Abdomen surgery.    Social History:  reports that he has never smoked. He does not have any smokeless tobacco history on file. He reports that he does not drink alcohol or use  illicit drugs.    Family History: family history is not on file.     The patient???s home medications have been reviewed.    Allergies: Review of patient's allergies indicates no known allergies.    -------------------------------------------------- RESULTS -------------------------------------------------    LABS:  Results for orders placed during the hospital encounter of 09/16/14   CBC WITH AUTO DIFFERENTIAL       Result Value Ref Range    WBC 16.3 (*) 4.5 - 11.5 E9/L    RBC 5.80  3.80 - 5.80 E12/L    Hemoglobin 15.6  12.5 - 16.5 g/dL    Hematocrit 16.148.4  09.637.0 - 54.0 %    MCV 83.4  80.0 - 99.9 fL    MCH 27.0  26.0 - 35.0 pg    MCHC 32.3  32.0 - 34.5 %    RDW 14.1  11.5 - 15.0 fL    Platelets 345  130 - 450 E9/L    MPV 8.0  7.0 - 12.0 fL    Neutrophils Relative 88 (*) 43 - 80 %    Lymphocytes Relative 8 (*) 20 - 42 %    Monocytes Relative 4  2 - 12 %    Eosinophils Relative Percent 0  0 - 6 %    Basophils Relative 0  0 - 2 %    Neutrophils Absolute 14.35 (*) 1.80 - 7.30 E9/L    Lymphocytes Absolute 1.24 (*) 1.50 - 4.00 E9/L    Monocytes Absolute 0.69  0.10 - 0.95 E9/L    Eosinophils Absolute 0.02 (*) 0.05 - 0.50 E9/L    Basophils Absolute 0.05  0.00 - 0.20 E9/L   COMPREHENSIVE METABOLIC PANEL       Result Value Ref Range    Sodium 139  132 - 146 mmol/L    Potassium 4.0  3.5 - 5.0 mmol/L    Chloride 100  98 - 107 mmol/L    CO2 23  22 - 29 mmol/L    Anion Gap 16  7 - 16 mmol/L    Glucose 110 (*) 74 - 109 mg/dL    BUN 14  6 - 20 mg/dL    CREATININE 0.9  0.7 - 1.2 mg/dL    GFR Non-African American >60  >=60 mL/min/1.73    GFR African American >60      Calcium 10.1  8.6 - 10.2 mg/dL    Total Protein 7.7  6.4 - 8.3 g/dL    Alb 4.1  3.5 - 5.2 g/dL    Total Bilirubin 0.5  0.0 - 1.2 mg/dL    Alkaline Phosphatase 70  40 - 129 U/L    ALT 27  0 - 40 U/L    AST 20  0 - 39 U/L   LIPASE       Result Value Ref Range    Lipase 23  13 - 60 U/L   LACTIC ACID, PLASMA       Result Value Ref Range    Lactic Acid 1.4  0.5 - 2.2 mmol/L        RADIOLOGY:  CT abdomen/pelvis with IV and oral contrast ([facility] read): Basilar atelectasis. No gallstones  by CT, no ductal dilation. No ureteral stones or obstructive uropathy. Multiple loops of fluid-filled small bowel are dilated up to about 5.8 cm consistent with some degree of mid-small bowel obstruction. Moderate left sided ventral hernia contains both dilated and nondilated small bowel loops. No gross wall thickening or inflammation. There is no specific evidence of acute appendicitis. No significant free fluid in the pelvis. There are no focal areas of inflammation. There is no apparent free air in the abdomen or pelvis. Smaller ventral hernia to the right of the midline contains fat.        ------------------------- NURSING NOTES AND VITALS REVIEWED ---------------------------  Date / Time Roomed:  09/16/2014 11:19 PM  ED Bed Assignment:  07/07    The nursing notes within the ED encounter and vital signs as below have been reviewed.     Patient Vitals for the past 24 hrs:   BP Temp Temp src Pulse Resp SpO2 Height Weight   09/17/14 0224 136/79 mmHg - - 90 16 98 % - -   09/16/14 2328 123/88 mmHg 98 ??F (36.7 ??C) Oral 69 18 98 % 6' (1.829 m) 255 lb (115.667 kg)       Oxygen Saturation Interpretation: Normal    ------------------------------------------ PROGRESS NOTES ------------------------------------------  Re-evaluation(s):  Time: 0340.  Patient???s symptoms are improving  Repeat physical examination is improved    Counseling:  I have spoken with the patient and discussed today???s results, in addition to providing specific details for the plan of care and counseling regarding the diagnosis and prognosis.  Their questions are answered at this time and they are agreeable with the plan of admission.    --------------------------------- ADDITIONAL PROVIDER NOTES ---------------------------------  Consultations:  Time: 0345. Spoke with Dr. Margarita GrizzleWoodruff.  Discussed case.  They will admit the patient.  This  patient's ED course included: a personal history and physicial examination, re-evaluation prior to disposition, multiple bedside re-evaluations and IV medications    This patient has remained hemodynamically stable during their ED course.    Diagnosis:  1. Small bowel obstruction (HCC)    2. Ventral hernia with obstruction and without gangrene        Disposition:  Patient's disposition: Admit to med/surg floor  Patient's condition is stable.          ATTENDING PROVIDER ATTESTATION:     I have personally performed and/or participated in the history, exam, medical decision making, and procedures and agree with all pertinent clinical information.      I have also reviewed and agree with the past medical, family and social history unless otherwise noted.    I have discussed this patient in detail with the resident, and provided the instruction and education regarding abdominal pain, nausea, vomiting, diarrhea, gastroenteritis, and small bowel obstruction.    My findings/Plan: Heart RRR. Lungs CTA bilaterally. Abdomen soft. Diffusely tender. Bowel sounds normal.         Ladon Applebaumarren Naraya Stoneberg, DO  09/17/14 0352    Ladon Applebaumarren Acxel Dingee, DO  09/17/14 [phone]

## 2014-09-17 ENCOUNTER — Encounter: Admit: 2014-09-17 | Primary: Family Medicine

## 2014-09-17 ENCOUNTER — Inpatient Hospital Stay
Admission: EM | Admit: 2014-09-17 | Discharge: 2014-09-17 | Disposition: A | Discharge status: Home / Self Care | Source: Home / Self Care | Admitting: Surgery

## 2014-09-17 DIAGNOSIS — K56609 Unspecified intestinal obstruction, unspecified as to partial versus complete obstruction: Secondary | ICD-10-CM

## 2014-09-17 LAB — CBC WITH AUTO DIFFERENTIAL
Basophils %: 0 % (ref 0–2)
Basophils Absolute: 0.05 E9/L (ref 0.00–0.20)
Eosinophils %: 0 % (ref 0–6)
Eosinophils Absolute: 0.02 E9/L — ABNORMAL LOW (ref 0.05–0.50)
Hematocrit: 48.4 % (ref 37.0–54.0)
Hemoglobin: 15.6 g/dL (ref 12.5–16.5)
Lymphocytes %: 8 % — ABNORMAL LOW (ref 20–42)
Lymphocytes Absolute: 1.24 E9/L — ABNORMAL LOW (ref 1.50–4.00)
MCH: 27 pg (ref 26.0–35.0)
MCHC: 32.3 % (ref 32.0–34.5)
MCV: 83.4 fL (ref 80.0–99.9)
MPV: 8 fL (ref 7.0–12.0)
Monocytes %: 4 % (ref 2–12)
Monocytes Absolute: 0.69 E9/L (ref 0.10–0.95)
Neutrophils %: 88 % — ABNORMAL HIGH (ref 43–80)
Neutrophils Absolute: 14.35 E9/L — ABNORMAL HIGH (ref 1.80–7.30)
Platelets: 345 E9/L (ref 130–450)
RBC: 5.8 E12/L (ref 3.80–5.80)
RDW: 14.1 fL (ref 11.5–15.0)
WBC: 16.3 E9/L — ABNORMAL HIGH (ref 4.5–11.5)

## 2014-09-17 LAB — COMPREHENSIVE METABOLIC PANEL
ALT: 27 U/L (ref 0–40)
AST: 20 U/L (ref 0–39)
Albumin: 4.1 g/dL (ref 3.5–5.2)
Alkaline Phosphatase: 70 U/L (ref 40–129)
Anion Gap: 16 mmol/L (ref 7–16)
BUN: 14 mg/dL (ref 6–20)
CO2: 23 mmol/L (ref 22–29)
Calcium: 10.1 mg/dL (ref 8.6–10.2)
Chloride: 100 mmol/L (ref 98–107)
Creatinine: 0.9 mg/dL (ref 0.7–1.2)
GFR African American: >60
GFR Non-African American: >60 mL/min/{1.73_m2} (ref >=60)
Glucose: 110 mg/dL — ABNORMAL HIGH (ref 74–109)
Potassium: 4 mmol/L (ref 3.5–5.0)
Sodium: 139 mmol/L (ref 132–146)
Total Bilirubin: 0.5 mg/dL (ref 0.0–1.2)
Total Protein: 7.7 g/dL (ref 6.4–8.3)

## 2014-09-17 LAB — LIPASE: Lipase: 23 U/L (ref 13–60)

## 2014-09-17 LAB — LACTIC ACID: Lactic Acid: 1.4 mmol/L (ref 0.5–2.2)

## 2014-09-17 MED ORDER — PANTOPRAZOLE SODIUM 40 MG IV SOLR
40 MG | Freq: Every day | INTRAVENOUS | Status: DC
Start: 2014-09-17 — End: 2014-09-17
  Administered 2014-09-17: 14:00:00 40 mg via INTRAVENOUS

## 2014-09-17 MED ORDER — MORPHINE SULFATE 10 MG/ML IJ SOLN
10 MG/ML | Freq: Once | INTRAMUSCULAR | Status: AC
Start: 2014-09-17 — End: 2014-09-17
  Administered 2014-09-17: 05:00:00 7 mg via INTRAVENOUS

## 2014-09-17 MED ORDER — ONDANSETRON HCL 4 MG/2ML IJ SOLN
4 MG/2ML | Freq: Four times a day (QID) | INTRAMUSCULAR | Status: DC | PRN
Start: 2014-09-17 — End: 2014-09-17

## 2014-09-17 MED ORDER — KETOROLAC TROMETHAMINE 30 MG/ML IJ SOLN
30 MG/ML | Freq: Four times a day (QID) | INTRAMUSCULAR | Status: DC
Start: 2014-09-17 — End: 2014-09-17
  Administered 2014-09-17: 11:00:00 30 mg via INTRAVENOUS

## 2014-09-17 MED ORDER — IOHEXOL 240 MG/ML IJ SOLN
240 MG/ML | Freq: Once | INTRAMUSCULAR | Status: AC | PRN
Start: 2014-09-17 — End: 2014-09-17
  Administered 2014-09-17: 07:00:00 50 mL via ORAL

## 2014-09-17 MED ORDER — GLYCERIN (LAXATIVE) 2.1 G RE SUPP
2.1 g | Freq: Once | RECTAL | Status: DC
Start: 2014-09-17 — End: 2014-09-17

## 2014-09-17 MED ORDER — IOVERSOL 68 % IV SOLN
68 % | Freq: Once | INTRAVENOUS | Status: AC | PRN
Start: 2014-09-17 — End: 2014-09-17
  Administered 2014-09-17: 07:00:00 100 mL via INTRAVENOUS

## 2014-09-17 MED ORDER — SODIUM CHLORIDE 0.9 % IJ SOLN
0.9 % | Freq: Every day | INTRAMUSCULAR | Status: DC
Start: 2014-09-17 — End: 2014-09-17
  Administered 2014-09-17: 14:00:00 10 mL via INTRAVENOUS

## 2014-09-17 MED ORDER — SODIUM CHLORIDE 0.9 % IV BOLUS
0.9 % | Freq: Once | INTRAVENOUS | Status: AC
Start: 2014-09-17 — End: 2014-09-17
  Administered 2014-09-17: 05:00:00 2000 mL via INTRAVENOUS

## 2014-09-17 MED ORDER — POTASSIUM CHLORIDE 2 MEQ/ML IV SOLN
2 MEQ/ML | INTRAVENOUS | Status: DC
Start: 2014-09-17 — End: 2014-09-17
  Administered 2014-09-17: 11:00:00 via INTRAVENOUS

## 2014-09-17 MED ORDER — HYDROMORPHONE HCL 1 MG/ML IJ SOLN
1 MG/ML | Freq: Once | INTRAMUSCULAR | Status: AC
Start: 2014-09-17 — End: 2014-09-17
  Administered 2014-09-17: 07:00:00 1 mg via INTRAVENOUS

## 2014-09-17 MED ORDER — PROMETHAZINE HCL 25 MG/ML IJ SOLN
25 MG/ML | Freq: Once | INTRAMUSCULAR | Status: AC
Start: 2014-09-17 — End: 2014-09-17
  Administered 2014-09-17: 05:00:00 25 mg via INTRAMUSCULAR

## 2014-09-17 MED FILL — GLYCERIN (ADULT) 2.1 G RE SUPP: 2.1 g | RECTAL | Qty: 1

## 2014-09-17 MED FILL — POTASSIUM CHLORIDE 2 MEQ/ML IV SOLN: 2 MEQ/ML | INTRAVENOUS | Qty: 10

## 2014-09-17 MED FILL — SODIUM CHLORIDE 0.9 % IV SOLN: 0.9 % | INTRAVENOUS | Qty: 2000

## 2014-09-17 MED FILL — MORPHINE SULFATE 10 MG/ML IJ SOLN: 10 mg/mL | INTRAMUSCULAR | Qty: 1

## 2014-09-17 MED FILL — PROMETHAZINE HCL 25 MG/ML IJ SOLN: 25 MG/ML | INTRAMUSCULAR | Qty: 1

## 2014-09-17 MED FILL — KETOROLAC TROMETHAMINE 30 MG/ML IJ SOLN: 30 MG/ML | INTRAMUSCULAR | Qty: 1

## 2014-09-17 MED FILL — SODIUM CHLORIDE 0.9 % IJ SOLN: 0.9 % | INTRAMUSCULAR | Qty: 10

## 2014-09-17 MED FILL — HYDROMORPHONE HCL 1 MG/ML IJ SOLN: 1 MG/ML | INTRAMUSCULAR | Qty: 1

## 2014-09-17 MED FILL — PROTONIX 40 MG IV SOLR: 40 MG | INTRAVENOUS | Qty: 40

## 2014-09-17 NOTE — Consults (Signed)
Physician Consult                                                                 General Surgery    Consulted Physician: Dr. Margarita GrizzleWoodruff, M.D.    CC: ventral hernia    HPI: 43 year old male consulted for ventral hernia with small bowel obstruction. He had abdominal pain, nausea, and emesis yesterday evening which has now resolved. He has a history of perforated diverticulitis with hartmann's procedure and subsequent reversal (Dr. Meryl Dareevito) and multiple hernia surgeries in the past (Dr. Vassie LollShiley). He has been having recurrent pain and obstructions from the hernia. Today the pain and nausea have resolved and the hernia is reduced. Bowels moved over night.    Past Medical History   Diagnosis Date   ??? Diverticulitis    ??? Hernia        Past Surgical History   Procedure Laterality Date   ??? Hernia repair  2008   ??? Appendectomy     ??? Abdomen surgery       Bowel removal       Current Facility-Administered Medications   Medication Dose Route Frequency Provider Last Rate Last Dose   ??? potassium chloride 20 mEq in lactated ringers 1,000 mL infusion   Intravenous Continuous Arman Filterobert Andrik Sandt, MD 125 mL/hr at 09/17/14 0555     ??? ketorolac (TORADOL) injection 30 mg  30 mg Intravenous Q6H Arman Filterobert Chau Savell, MD   30 mg at 09/17/14 0555   ??? ondansetron (ZOFRAN) injection 4 mg  4 mg Intravenous Q6H PRN Arman Filterobert Terease Marcotte, MD       ??? pantoprazole (PROTONIX) injection 40 mg  40 mg Intravenous Daily Arman Filterobert Cola Highfill, MD   40 mg at 09/17/14 [id number]    And   ??? sodium chloride (PF) 0.9 % injection 10 mL  10 mL Intravenous Daily Arman Filterobert Tilia Faso, MD   10 mL at 09/17/14 [id number]   ??? glycerin (Laxative) 2.1 g  1 suppository Rectal Once Arman Filterobert Kirandeep Fariss, MD           No Known Allergies    No family history on file.    History     Social History   ??? Marital Status: Married     Spouse Name: N/A     Number of Children: N/A   ??? Years of Education: N/A     Occupational History   ??? Not on file.     Social  History Main Topics   ??? Smoking status: Never Smoker    ??? Smokeless tobacco: Not on file   ??? Alcohol Use: No      Comment: social   ??? Drug Use: No   ??? Sexual Activity:     Partners: Female     Other Topics Concern   ??? Not on file     Social History Narrative            Physical Exam:    BP 115/77 mmHg   Pulse 75   Temp(Src) 97.7 ??F (36.5 ??C) (Oral)   Resp 20   Ht 6' (1.829 m)   Wt 259 lb 1.6 oz (117.527 kg)   BMI 35.13 kg/m2   SpO2 95%    Intake/Output Summary (Last 24 hours) at 09/17/14 0949  Last data  filed at 09/17/14 0555   Gross per 24 hour   Intake      0 ml   Output    800 ml   Net   -800 ml     I/O last 3 completed shifts:  In: -   Out: 800 [Emesis/NG output:800]       General: resting NAD  Resp: nonlabored  Abdomen: soft, ND, mild central abdominal discomfort, prior scars present, reduced hernia    CBC with Differential:    Lab Results   Component Value Date    WBC 16.3 09/17/2014    RBC 5.80 09/17/2014    HGB 15.6 09/17/2014    HCT 48.4 09/17/2014    PLT 345 09/17/2014    MCV 83.4 09/17/2014    MCH 27.0 09/17/2014    MCHC 32.3 09/17/2014    RDW 14.1 09/17/2014    SEGSPCT 82 05/26/2012    LYMPHOPCT 8 09/17/2014    LYMPHOPCT 10 05/26/2012    MONOPCT 4 09/17/2014    MONOPCT 6 05/26/2012    BASOPCT 0 09/17/2014    BASOPCT 0 05/26/2012    MONOSABS 0.69 09/17/2014    MONOSABS 0.93 05/26/2012    LYMPHSABS 1.24 09/17/2014    LYMPHSABS 1.51 05/26/2012    EOSABS 0.02 09/17/2014    EOSABS 0.19 05/26/2012    BASOSABS 0.05 09/17/2014     CMP:    Lab Results   Component Value Date    NA 139 09/17/2014    K 4.0 09/17/2014    CL 100 09/17/2014    CO2 23 09/17/2014    BUN 14 09/17/2014    CREATININE 0.9 09/17/2014    GFRAA >60 09/17/2014    LABGLOM >60 09/17/2014    LABGLOM >60 05/26/2012    GLUCOSE 110 09/17/2014    PROT 7.7 09/17/2014    LABALBU 4.1 09/17/2014    CALCIUM 10.1 09/17/2014    BILITOT 0.5 09/17/2014    ALKPHOS 70 09/17/2014    AST 20 09/17/2014    ALT 27 09/17/2014       Radiology: CT abdomen pelvis w/ IV  and po contrast  Moderate to high grade mid to distal small bowel obstruction. Large ventral hernia containing dilated small bowel. Obstruction relieved once the hernia was reduced.      Assessment/Plan: 43 year old male with symptomatic ventral hernia  1) remove ng tube, start full liquids  2) pre op for Ventral hernia repair with mesh -- planned for 09/19/2014    Almond Lintobert W Bela Bonaparte MD   09/17/2014  9:49 AM

## 2014-09-17 NOTE — ED Notes (Signed)
Patient presents with mid abdominal pain and vomiting beginning around 1900 this evening. Patient has a history of diverticulitis and multiple abdominal surgeries.     Earma ReadingJill Melessa Cowell, RN  09/17/14 [phone]

## 2014-09-17 NOTE — ED Notes (Signed)
Patient to CT via cart      Earma ReadingJill Samah Lapiana, RN  09/17/14 [phone]

## 2014-09-17 NOTE — Plan of Care (Signed)
Problem: Bowel/Gastric:  Goal: Ability to achieve a regular elimination pattern will improve  Ability to achieve a regular elimination pattern will improve  Outcome: Ongoing

## 2014-09-17 NOTE — H&P (Signed)
Physician Consult  General Surgery    Consulted Physician: Dr. Margarita GrizzleWoodruff, M.D.    CC: ventral hernia    HPI: 43 year old male consulted for ventral hernia with small bowel obstruction. He had abdominal pain, nausea, and emesis yesterday evening which has now resolved. He has a history of perforated diverticulitis with hartmann's procedure and subsequent reversal (Dr. Meryl Dareevito) and multiple hernia surgeries in the past (Dr. Vassie LollShiley). He has been having recurrent pain and obstructions from the hernia. Today the pain and nausea have resolved and the hernia is reduced. Bowels moved over night.   Past Medical History    Past Medical History    Diagnosis  Date    ???  Diverticulitis     ???  Hernia           Past Surgical History    Past Surgical History    Procedure  Laterality  Date    ???  Hernia repair   2008    ???  Appendectomy      ???  Abdomen surgery        Bowel removal          Current Facility-Administered Medications    Current Facility-Administered Medications    Medication  Dose  Route  Frequency  Provider  Last Rate  Last Dose    ???  potassium chloride 20 mEq in lactated ringers 1,000 mL infusion   Intravenous  Continuous  Arman Filterobert Chavy Avera, MD  125 mL/hr at 09/17/14 0555     ???  ketorolac (TORADOL) injection 30 mg  30 mg  Intravenous  Q6H  Arman Filterobert Gaylan Fauver, MD   30 mg at 09/17/14 0555    ???  ondansetron (ZOFRAN) injection 4 mg  4 mg  Intravenous  Q6H PRN  Arman Filterobert Aubery Douthat, MD      ???  pantoprazole (PROTONIX) injection 40 mg  40 mg  Intravenous  Daily  Arman Filterobert Namari Breton, MD   40 mg at 09/17/14 [id number]     And    ???  sodium chloride (PF) 0.9 % injection 10 mL  10 mL  Intravenous  Daily  Arman Filterobert Tigran Haynie, MD   10 mL at 09/17/14 [id number]    ???  glycerin (Laxative) 2.1 g  1 suppository  Rectal  Once  Arman Filterobert Phuc Kluttz, MD             No Known Allergies     Family History    No family history on file.      Social History    History    Social History    ???  Marital Status:  Married      Spouse Name:  N/A      Number of Children:  N/A    ???  Years of  Education:  N/A    Occupational History    ???  Not on file.    Social History Main Topics    ???  Smoking status:  Never Smoker    ???  Smokeless tobacco:  Not on file    ???  Alcohol Use:  No       Comment: social    ???  Drug Use:  No    ???  Sexual Activity:      Partners:  Female    Other Topics  Concern    ???  Not on file    Social History Narrative               Physical Exam:    BP  115/77 mmHg   Pulse 75   Temp(Src) 97.7 ??F (36.5 ??C) (Oral)   Resp 20   Ht 6' (1.829 m)   Wt 259 lb 1.6 oz (117.527 kg)   BMI 35.13 kg/m2   SpO2 95%    Intake/Output Summary (Last 24 hours) at 09/17/14 0949  Last data filed at 09/17/14 0555   Gross per 24 hour    Intake  0 ml    Output  800 ml    Net  -800 ml      I/O last 3 completed shifts:  In: -   Out: 800 [Emesis/NG output:800]       General: resting NAD  Resp: nonlabored  Abdomen: soft, ND, mild central abdominal discomfort, prior scars present, reduced hernia    CBC with Differential:   Lab Results    Component  Value  Date     WBC  16.3  09/17/2014     RBC  5.80  09/17/2014     HGB  15.6  09/17/2014     HCT  48.4  09/17/2014     PLT  345  09/17/2014     MCV  83.4  09/17/2014     MCH  27.0  09/17/2014     MCHC  32.3  09/17/2014     RDW  14.1  09/17/2014     SEGSPCT  82  05/26/2012     LYMPHOPCT  8  09/17/2014     LYMPHOPCT  10  05/26/2012     MONOPCT  4  09/17/2014     MONOPCT  6  05/26/2012     BASOPCT  0  09/17/2014     BASOPCT  0  05/26/2012     MONOSABS  0.69  09/17/2014     MONOSABS  0.93  05/26/2012     LYMPHSABS  1.24  09/17/2014     LYMPHSABS  1.51  05/26/2012     EOSABS  0.02  09/17/2014     EOSABS  0.19  05/26/2012     BASOSABS  0.05  09/17/2014      CMP:   Lab Results    Component  Value  Date     NA  139  09/17/2014     K  4.0  09/17/2014     CL  100  09/17/2014     CO2  23  09/17/2014     BUN  14  09/17/2014     CREATININE  0.9  09/17/2014     GFRAA  >60  09/17/2014     LABGLOM  >60  09/17/2014     LABGLOM  >60  05/26/2012     GLUCOSE  110  09/17/2014     PROT  7.7   09/17/2014     LABALBU  4.1  09/17/2014     CALCIUM  10.1  09/17/2014     BILITOT  0.5  09/17/2014     ALKPHOS  70  09/17/2014     AST  20  09/17/2014     ALT  27  09/17/2014        Radiology: CT abdomen pelvis w/ IV and po contrast  Moderate to high grade mid to distal small bowel obstruction. Large ventral hernia containing dilated small bowel. Obstruction relieved once the hernia was reduced.      Assessment/Plan: 43 year old male with symptomatic ventral hernia  1) remove ng tube, start full liquids  2) pre op for Ventral hernia repair with  mesh -- planned for 09/19/2014    Almond Lint MD   09/17/2014  9:49 AM

## 2014-09-17 NOTE — Care Coordination-Inpatient (Signed)
[facility] Quality Flow/Interdisciplinary Rounds Progress Note        Quality Flow Rounds held on September 17, 2014    Disciplines Attending:  Bedside Nurse, Social Worker, Case Manager and Nursing Unit Leadership    Jenetta LogesKenneth J Liming was admitted on 09/17/2014  3:59 AM    Anticipated Discharge Date:  Expected Discharge Date: 09/18/14    Disposition:    Braden Score:  Braden Scale Score: 22    Readmission Risk            Readmission Risk:       1.25       Age 44 or Greater: 0    Admitted from SNF or Requires Paid or Family Care: 0    Currently has CHF,COPD,ARF,CRI,or is on dialysis: 0    Takes more than 5 Prescription Medications: 0    Takes Digoxin,Insulin,Anticoagulants,Narcotics or ASA/Plavix: 0    Hospital Admit in Past 12 Months: 0    On Disability: 0    Patient Considers own Health: 1.25          Discussed patient goal for the day, patient clinical progression, and barriers to discharge.  The following Goal(s) of the Day/Commitment(s) have been identified:  Diagnostics - Report Results, IV Fluids - Obtain Order to Titrate or Discontinue, Labs - Report Results and Pain Management  increase pain  level      Sinclair GroomsMelissa Keosha Rossa  September 17, 2014

## 2014-09-18 NOTE — Patient Instructions (Signed)
[facility]                                                                                                                     PRE OP INSTRUCTIONS FOR  Jay LogesKenneth J Maxwell        Date: 09/18/2014    Date and time of surgery: 09/19/2014 745   Arrival Time: 615, main lobby info desk    1. Do not eat or drink anything after 12 midnight prior to surgery. This includes no water, chewing gum, mints or ice chips.    2. Take the following pills with a small sip of water on the morning of Surgery: gabapentin     3. Diabetics may take evening dose of insulin but none after midnight.  If you feel symptomatic or low blood sugar take 1-2 ounces of apple juice only.    4. Aspirin, Ibuprofen, Advil, Naproxen, Vitamin E and other Anti-inflammatory products should be stopped  before surgery  as directed by your physician.    5. Check with your Doctor regarding stopping Plavix, Coumadin, Lovenox, Fragmin or other blood thinners.    6. Do not smoke,use illicit drugs and do not drink any alcoholic beverages 24 hours prior to surgery.    7. You may brush your teeth and gargle the morning of surgery.  DO NOT SWALLOW WATER    8. You MUST make arrangements for a responsible adult to take you home after your surgery. You will not be allowed to leave alone or drive yourself home.  It is strongly suggested someone stay with you the first 24 hrs. Your surgery will be cancelled if you do not have a ride home.    9. A parent/legal guardian must accompany a child scheduled for surgery and plan to stay at the hospital until the child is discharged.  Please do not bring other children with you.    10. Please wear simple, loose fitting clothing to the hospital.  Do not bring valuables (money, credit cards, checkbooks, etc.) Do not wear any makeup (including no eye makeup) or nail polish on your fingers or toes.    11. DO NOT wear any jewelry or piercings on day of surgery.  All body piercing jewelry must be removed.    12. Shower the night  before surgery with _x__Antibacterial soap ___Hibiclens.  No lotion, cream, powder.  Ok to use deodorant.    13. Remember to bring Blood Bank bracelet to the hospital on the day of surgery.    14. If you have a Living Will and Durable Power of Attorney for Healthcare, please bring in a copy.    15. If appropriate bring crutches, inspirex, etc...    16. Notify your Surgeon if you develop any illness between now and surgery time, cough, cold, fever, sore throat, nausea, vomiting, etc.  Please notify your surgeon if you experience dizziness, shortness of breath or blurred vision between now & the time of your surgery.    17.  If you have ___dentures, they will be removed before going to the OR; we will provide you a container. If you wear ___contact lenses or ___glasses, they will be removed; please bring a case for them.    18. To provide excellent care visitors will be limited to one in the room at any given time.    19. Please bring picture ID and insurance card.    20. Sleep apnea patients need to bring CPAP to hospital on day of surgery.                                                                                         21. Visit our web site for additional information: [email or web address]    22. During flu season no children under the age of 63 are permitted in the hospital for the safety of all patients.     23. Other                 Please call pre admission testing if you have any further questions.   [place]i     [place]

## 2014-09-19 ENCOUNTER — Inpatient Hospital Stay: Admit: 2014-09-19 | Attending: Surgery | Primary: Family Medicine

## 2014-09-19 MED ORDER — DEXAMETHASONE SODIUM PHOSPHATE 10 MG/ML IJ SOLN
10 MG/ML | INTRAMUSCULAR | Status: AC
Start: 2014-09-19 — End: ?

## 2014-09-19 MED ORDER — SODIUM CHLORIDE 0.9 % IV SOLN
0.9 % | INTRAVENOUS | Status: DC
Start: 2014-09-19 — End: 2014-09-20
  Administered 2014-09-19: 12:00:00 via INTRAVENOUS

## 2014-09-19 MED ORDER — GLYCOPYRROLATE 1 MG/5ML IJ SOLN
1 MG/5ML | INTRAMUSCULAR | Status: AC
Start: 2014-09-19 — End: ?

## 2014-09-19 MED ORDER — HYDROMORPHONE HCL 1 MG/ML IJ SOLN
1 MG/ML | INTRAMUSCULAR | Status: AC
Start: 2014-09-19 — End: 2014-09-19

## 2014-09-19 MED ORDER — MIDAZOLAM HCL 2 MG/2ML IJ SOLN
2 MG/ML | INTRAMUSCULAR | Status: AC
Start: 2014-09-19 — End: ?

## 2014-09-19 MED ORDER — MEPERIDINE HCL 25 MG/ML IJ SOLN
25 MG/ML | INTRAMUSCULAR | Status: DC | PRN
Start: 2014-09-19 — End: 2014-09-20
  Administered 2014-09-19 (×2): 12.5 mg via INTRAVENOUS

## 2014-09-19 MED ORDER — HYDROCODONE-ACETAMINOPHEN 5-325 MG PO TABS
5-325 MG | ORAL | Status: AC
Start: 2014-09-19 — End: ?

## 2014-09-19 MED ORDER — PROPOFOL 10 MG/ML IV EMUL
10 MG/ML | INTRAVENOUS | Status: AC
Start: 2014-09-19 — End: ?

## 2014-09-19 MED ORDER — HYDROCODONE-ACETAMINOPHEN 5-325 MG PO TABS
5-325 MG | Freq: Once | ORAL | Status: AC
Start: 2014-09-19 — End: 2014-09-19
  Administered 2014-09-19: 19:00:00 1 via ORAL

## 2014-09-19 MED ORDER — HYDROCODONE-ACETAMINOPHEN 5-325 MG PO TABS
5-325 MG | Freq: Once | ORAL | Status: AC
Start: 2014-09-19 — End: 2014-09-19
  Administered 2014-09-19: 18:00:00 1 via ORAL

## 2014-09-19 MED ORDER — FENTANYL CITRATE 0.05 MG/ML IJ SOLN
0.05 MG/ML | INTRAMUSCULAR | Status: AC
Start: 2014-09-19 — End: ?

## 2014-09-19 MED ORDER — ONDANSETRON HCL 4 MG/2ML IJ SOLN
4 MG/2ML | INTRAMUSCULAR | Status: AC
Start: 2014-09-19 — End: ?

## 2014-09-19 MED ORDER — CEFAZOLIN SODIUM-DEXTROSE 2-3 GM-% IV SOLR
2-3 GM-% | INTRAVENOUS | Status: AC
Start: 2014-09-19 — End: 2014-09-19
  Administered 2014-09-19: 13:00:00 2 g via INTRAVENOUS

## 2014-09-19 MED ORDER — MEPERIDINE HCL 25 MG/ML IJ SOLN
25 MG/ML | INTRAMUSCULAR | Status: AC
Start: 2014-09-19 — End: 2014-09-19

## 2014-09-19 MED ORDER — NEOSTIGMINE METHYLSULFATE 1 MG/ML IJ SOLN
1 MG/ML | INTRAMUSCULAR | Status: AC
Start: 2014-09-19 — End: ?

## 2014-09-19 MED ORDER — HYDROMORPHONE HCL 1 MG/ML IJ SOLN
1 MG/ML | INTRAMUSCULAR | Status: AC | PRN
Start: 2014-09-19 — End: 2014-09-19
  Administered 2014-09-19: 17:00:00 0.5 mg via INTRAVENOUS
  Administered 2014-09-19: 16:00:00 via INTRAVENOUS
  Administered 2014-09-19 (×2): 0.5 mg via INTRAVENOUS

## 2014-09-19 MED ORDER — HYDROCODONE-ACETAMINOPHEN 5-325 MG PO TABS
5-325 MG | ORAL_TABLET | ORAL | Status: DC | PRN
Start: 2014-09-19 — End: 2014-11-08

## 2014-09-19 MED ORDER — BUPIVACAINE-EPINEPHRINE (PF) 0.25% -1:200000 IJ SOLN
INTRAMUSCULAR | Status: AC
Start: 2014-09-19 — End: ?

## 2014-09-19 MED ORDER — LIDOCAINE HCL (CARDIAC) 20 MG/ML IV SOLN
20 MG/ML | INTRAVENOUS | Status: AC
Start: 2014-09-19 — End: ?

## 2014-09-19 MED FILL — BUPIVACAINE-EPINEPHRINE (PF) 0.25% -1:200000 IJ SOLN: INTRAMUSCULAR | Qty: 20

## 2014-09-19 MED FILL — GLYCOPYRROLATE 1 MG/5ML IJ SOLN: 1 MG/5ML | INTRAMUSCULAR | Qty: 5

## 2014-09-19 MED FILL — HYDROMORPHONE HCL 1 MG/ML IJ SOLN: 1 MG/ML | INTRAMUSCULAR | Qty: 1

## 2014-09-19 MED FILL — DEMEROL 25 MG/ML IJ SOLN: 25 MG/ML | INTRAMUSCULAR | Qty: 1

## 2014-09-19 MED FILL — HYDROCODONE-ACETAMINOPHEN 5-325 MG PO TABS: 5-325 MG | ORAL | Qty: 1

## 2014-09-19 MED FILL — CEFAZOLIN SODIUM-DEXTROSE 2-3 GM-% IV SOLR: 2-3 GM-% | INTRAVENOUS | Qty: 2

## 2014-09-19 MED FILL — FENTANYL CITRATE 0.05 MG/ML IJ SOLN: 0.05 MG/ML | INTRAMUSCULAR | Qty: 10

## 2014-09-19 MED FILL — LIDOCAINE HCL (CARDIAC) 20 MG/ML IV SOLN: 20 MG/ML | INTRAVENOUS | Qty: 5

## 2014-09-19 MED FILL — NEOSTIGMINE METHYLSULFATE 1 MG/ML IJ SOLN: 1 MG/ML | INTRAMUSCULAR | Qty: 10

## 2014-09-19 MED FILL — DIPRIVAN 10 MG/ML IV EMUL: 10 MG/ML | INTRAVENOUS | Qty: 20

## 2014-09-19 MED FILL — ONDANSETRON HCL 4 MG/2ML IJ SOLN: 4 MG/2ML | INTRAMUSCULAR | Qty: 2

## 2014-09-19 MED FILL — DEXAMETHASONE SODIUM PHOSPHATE 10 MG/ML IJ SOLN: 10 MG/ML | INTRAMUSCULAR | Qty: 1

## 2014-09-19 MED FILL — MIDAZOLAM HCL 2 MG/2ML IJ SOLN: 2 MG/ML | INTRAMUSCULAR | Qty: 2

## 2014-09-19 NOTE — Op Note (Signed)
Jay Maxwell  [facility]    Operative Report  DATE OF PROCEDURE: 09/19/2014  SURGEON: Dr. Almond Lintobert W Kethan Papadopoulos, M.D.   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   ESTIMATED BLOOD LOSS: None.   COMPLICATIONS: None.     HISTORY: Jay Maxwell is a  43 y.o.  male, who weighs [id number].    He presented with a large, painful incisional hernia located in the mid upper abdomen. He was admitted with small bowel obstruction and incarceration which resolved over night.   We discussed the different medical and surgical options and he decided to proceed with operative repair with mesh. He understood the extensive risks involved in the surgery and wished to proceed.    OPERATION: The patient was placed on the table and placed under general anesthesia. He was given Ancef 2 g IV preop. SCDs were placed on the legs as DVT prophylaxis. Abdomen was prepped with ChloroPrep and draped in the usual sterile fashion. He had a large midline scar and a left horizontal colostomy scar with multiple hernia bulges. Marcaine 0.25% plus epinephrine was used to inject the skin and muscle on the right lateral abdomen to help with postop pain control. A 5 mm incision was made on the rightt lateral abdomen. An Ethicon 5 mm non-bladed trochar was used to enter the abdomen and insufflate with CO2. An angled 5 mm scope was used and there was good visualization. Two more 5 mm trochars were placed inferiorly. The bowel was stuck the the anterior abdominal wall and entered several hernia defects. The harmonic scalpel was used to dissect the fat away from the anterior abdominal wall and scissors were used to slowed free the loops of bowel. There were several separate defects identified. The largest left of the midline through the ostomy  site. Once all the adhesions were taken down, a horizontal incision was made over the hernia defect through the ostomy site incision. Dissection was then carried down with electrocautery. The large hernia sac was excised and sent off the field. The hernia defects were too large for one mesh, so both a 4.5 inch round mesh and a 10 x 6 inch Ventralight mesh with the Echo positioning system was inserted through the fascial defect. The fascia was closed with interrupted 0 vicryl figure of eight sutures. The smaller mesh was unraveled and pulled up the the hernia at the ostomy site first. The ProTack 5-mm Helical tacking device was used to place tacks in a circumferential fashion, roughly 1 cm apart around the mesh, securing it to the anterior abdominal wall. Next the balloon was inflated which unrolled the other mesh well, then it was pulled up to the abdominal wall.  The ProTack 5-mm Helical tacking device was used to place tacks in a circumferential fashion, roughly 1 cm apart around the mesh, securing it to the anterior abdominal wall then a second row of tacks was placed. The balloon system was removed through one of the 5 mm trocar sites. The mesh lay flat in good position covering all the defects. Pictures were taken, the repair looked good. The C02 was all released and trochars removed. 4-0 Monocryl was used to close the skin incisions. Derma+flex glue was placed on the incisions, no dressing. An abdominal binder was  placed. The needle and sponge count were correct. The patient tolerated the procedure well and went to recovery in stable condition.     Physician Signature: Electronically signed by Dr. Almond Lintobert W Kase Shughart, M.D.

## 2014-09-19 NOTE — Anesthesia Post-Procedure Evaluation (Signed)
Department of Anesthesiology  Post-Anesthesia Note    Name:  Jay Maxwell                                         Age:  43 y.o.  MRN:  [id number]     Procedure (Scheduled):  Laparoscopic Incisional Hernia Repair with Mesh.    Surgeon:  Dr. Arman Filterobert Woodruff.    Last Vitals:  BP 135/75 mmHg   Pulse 89   Temp(Src) 97.2 ??F (36.2 ??C) (Temporal)   Resp 14   Ht 6' (1.829 m)   Wt 254 lb 3 oz (115.299 kg)   BMI 34.47 kg/m2   SpO2 95%  Patient Vitals for the past 4 hrs:   BP Temp Temp src Pulse Resp SpO2   09/19/14 1205 135/75 mmHg - - 89 14 95 %   09/19/14 1200 136/77 mmHg - - 83 16 93 %   09/19/14 1155 136/77 mmHg - - 65 - 95 %   09/19/14 1150 135/75 mmHg - - 69 - 95 %   09/19/14 1145 135/75 mmHg - - 78 13 96 %   09/19/14 1140 137/78 mmHg - - 75 15 95 %   09/19/14 1135 137/78 mmHg - - 78 15 93 %   09/19/14 1130 143/76 mmHg 97.2 ??F (36.2 ??C) Temporal 80 13 92 %   09/19/14 1125 140/79 mmHg - - 80 - 94 %   09/19/14 1120 140/79 mmHg - - 78 17 96 %   09/19/14 1115 140/79 mmHg - - 73 21 98 %   09/19/14 1058 139/58 mmHg 97.8 ??F (36.6 ??C) Temporal 85 16 95 %       Last ICU Vitals:  Vital Signs  Temp: 97.2 ??F (36.2 ??C)  Temp Source: Temporal  Pulse: 89  Resp: 14  BP: 135/75 mmHg  SpO2: 95 %  Weight Method: Actual    Level of Consciousness:  Awake    Respiratory:  Stable    Oxygen Saturation:  Stable    Cardiovascular:  Stable    Hydration:  Adequate    PONV:  Stable    Post-op Pain:  Adequate analgesia    Post-op Assessment:  No apparent anesthetic complications    Additional Follow-Up / Treatment / Comment:  None    Virgilio FreesLouis G Llana Deshazo, MD  September 19, 2014   12:39 PM

## 2014-09-19 NOTE — Discharge Instructions (Signed)
Dr. Hilario QuarryWoodruff's  Discharge Instructions   for Hernia Repair       Home Care     Ok to shower. Do not remove the surgical glue. Leave the incisions open to air, only cover if drainage occurs.    Place ice on incisions to decrease the pain and bruising.    Wear the binder for 6 weeks, no strenuous activity.    Diet     You will be started on a clear-liquid diet after surgery and advanced to a regular diet, depending on your progress and tolerance.  You may notice increased gas or changes in your bowel habits during the first month after surgery. Keep the bowels soft with Metamucil, bran cereal, stool softeners or laxatives as needed.    Physical Activity     Walk frequently to prevent blood clots in the legs, but do not do anything that causes pain in the incisions.    Breath deeply every hour to prevent pneumonia.    Medications     Restart blood thinners in 24 hours if no sign of bleeding   Take Ibuprofen and Tylenol for moderate pain. Use the narcotic only for more severe pain.       Follow-up    Schedule a follow-up appointment for two weeks, [phone].     Call Your Doctor If Any of the Following Occurs      Signs of infection, including fever and chills    Redness, swelling, increasing pain, excessive bleeding, or discharge at the incision site    Cough, shortness of breath, chest pain    Increased abdominal pain    Nausea and/or vomiting that does not resolve off narcotics.   Pain, burning, urgency or frequency of urination, or blood in the urine    Pain and/or swelling in your feet, calves, or legs    Dark urine, light stools, or evidence of jaundice (yellowing of the skin or eyes).     In case of an emergency,  CALL 911  immediately.

## 2014-09-19 NOTE — Anesthesia Pre-Procedure Evaluation (Signed)
Department of Anesthesiology  Preprocedure Note       Name:  Jay Maxwell   Age:  43 y.o.  DOB:  [birth year]                                          MRN:  [id number]         Date:  09/19/2014          Department of Anesthesiology  Pre-Anesthesia Evaluation/Consultation       Name:  Jay Maxwell                                         Age:  43 y.o.  MRN:  [id number]           Procedure (Scheduled):  Laparoscopic Incisional Hernia Repair with Mesh.    Surgeon:  Dr. Arman Filter.     No Known Allergies  Patient Active Problem List   Diagnosis Code   ??? Small bowel obstruction (HCC) K56.69   ??? Incisional hernia K43.2     Past Medical History   Diagnosis Date   ??? Diverticulitis    ??? Hernia    ??? Restless leg      Past Surgical History   Procedure Laterality Date   ??? Hernia repair  2008   ??? Appendectomy     ??? Abdomen surgery       Bowel removal     History   Substance Use Topics   ??? Smoking status: Never Smoker    ??? Smokeless tobacco: Never Used   ??? Alcohol Use: No      Comment: social     Medications  Current Outpatient Prescriptions on File Prior to Encounter   Medication Sig Dispense Refill   ??? gabapentin (NEURONTIN) 600 MG tablet Take 300 mg by mouth 2 times daily      ??? rOPINIRole (REQUIP) 1 MG tablet Take 3 mg by mouth nightly      ??? ibuprofen (ADVIL;MOTRIN) 800 MG tablet Take 1 tablet by mouth every 6 hours as needed for Pain. 20 tablet 3     No current facility-administered medications on file prior to encounter.     Current Outpatient Prescriptions   Medication Sig Dispense Refill   ??? polyethylene glycol (GLYCOLAX) powder Take 17 g by mouth daily     ??? gabapentin (NEURONTIN) 600 MG tablet Take 300 mg by mouth 2 times daily      ??? rOPINIRole (REQUIP) 1 MG tablet Take 3 mg by mouth nightly      ??? ibuprofen (ADVIL;MOTRIN) 800 MG tablet Take 1 tablet by mouth every 6 hours as needed for Pain. 20 tablet 3     Current Facility-Administered Medications   Medication Dose Route Frequency Provider Last Rate Last Dose   ???  0.9 % sodium chloride infusion   Intravenous Continuous Arman Filter, MD 125 mL/hr at 09/19/14 0645     ??? ceFAZolin (ANCEF) 2 g in dextrose 3% 50mL IVPB (duplex)  2 g Intravenous On Call to OR Arman Filter, MD         Vital Signs (Current)   Filed Vitals:    09/19/14 0624   BP: 114/82   Pulse: 71   Temp: 97.7 ??F (36.5 ??C)   Resp: 16  SpO2: 98%     Vital Signs Statistics (for past 48 hrs)     BP  Min: 114/82   Min taken time: 09/19/14 0624  Max: 114/82   Max taken time: 09/19/14 0624  Temp  Avg: 97.7 ??F (36.5 ??C)  Min: 97.7 ??F (36.5 ??C)   Min taken time: 09/19/14 [id number]  Max: 97.7 ??F (36.5 ??C)   Max taken time: 09/19/14 0624  Pulse  Avg: 71  Min: 71   Min taken time: 09/19/14 0624  Max: 71   Max taken time: 09/19/14 0624  Resp  Avg: 16  Min: 16   Min taken time: 09/19/14 0624  Max: 16   Max taken time: 09/19/14 0624  SpO2  Avg: 98 %  Min: 98 %   Min taken time: 09/19/14 0624  Max: 98 %   Max taken time: 09/19/14 0624    BP Readings from Last 3 Encounters:   09/19/14 114/82   09/17/14 115/77   05/25/14 136/78     BMI  Body mass index is 34.47 kg/(m^2).  Estimated body mass index is 34.47 kg/(m^2) as calculated from the following:    Height as of this encounter: 6' (1.829 m).    Weight as of this encounter: 254 lb 3 oz (115.299 kg).    CBC   Lab Results   Component Value Date    WBC 16.3 09/17/2014    RBC 5.80 09/17/2014    HGB 15.6 09/17/2014    HCT 48.4 09/17/2014    MCV 83.4 09/17/2014    RDW 14.1 09/17/2014    PLT 345 09/17/2014     CMP    Lab Results   Component Value Date    NA 139 09/17/2014    K 4.0 09/17/2014    CL 100 09/17/2014    CO2 23 09/17/2014    BUN 14 09/17/2014    CREATININE 0.9 09/17/2014    GFRAA >60 09/17/2014    LABGLOM >60 09/17/2014    LABGLOM >60 05/26/2012    GLUCOSE 110 09/17/2014    PROT 7.7 09/17/2014    CALCIUM 10.1 09/17/2014    BILITOT 0.5 09/17/2014    ALKPHOS 70 09/17/2014    AST 20 09/17/2014    ALT 27 09/17/2014     BMP    Lab Results   Component Value Date    NA 139 09/17/2014     K 4.0 09/17/2014    CL 100 09/17/2014    CO2 23 09/17/2014    BUN 14 09/17/2014    CREATININE 0.9 09/17/2014    CALCIUM 10.1 09/17/2014    GFRAA >60 09/17/2014    LABGLOM >60 09/17/2014    LABGLOM >60 05/26/2012    GLUCOSE 110 09/17/2014     POCGlucose  Recent Labs     09/17/14   GLUCOSE  110*      Coags  No results found for: PROTIME, INR, APTT  HCG (If Applicable) No results found for: PREGTESTUR, PREGSERUM, HCG, HCGQUANT   ABGs No results found for: PHART, PO2ART, PCO2ART, HCO3ART, BEART, O2SATART   Type & Screen (If Applicable)  No results found for: [facility]  Radiology (If Applicable)  Cardiac Testing (If Applicable)   EKG (If Applicable)       CBC   Lab Results   Component Value Date    WBC 16.3 09/17/2014    RBC 5.80 09/17/2014    HGB 15.6 09/17/2014    HCT 48.4 09/17/2014    MCV 83.4 09/17/2014  RDW 14.1 09/17/2014    PLT 345 09/17/2014     CMP    Lab Results   Component Value Date    NA 139 09/17/2014    K 4.0 09/17/2014    CL 100 09/17/2014    CO2 23 09/17/2014    BUN 14 09/17/2014    CREATININE 0.9 09/17/2014    GFRAA >60 09/17/2014    LABGLOM >60 09/17/2014    LABGLOM >60 05/26/2012    GLUCOSE 110 09/17/2014    PROT 7.7 09/17/2014    CALCIUM 10.1 09/17/2014    BILITOT 0.5 09/17/2014    ALKPHOS 70 09/17/2014    AST 20 09/17/2014    ALT 27 09/17/2014     BMP    Lab Results   Component Value Date    NA 139 09/17/2014    K 4.0 09/17/2014    CL 100 09/17/2014    CO2 23 09/17/2014    BUN 14 09/17/2014    CREATININE 0.9 09/17/2014    CALCIUM 10.1 09/17/2014    GFRAA >60 09/17/2014    LABGLOM >60 09/17/2014    LABGLOM >60 05/26/2012    GLUCOSE 110 09/17/2014     POCGlucose  Recent Labs     09/17/14   GLUCOSE  110*        Coags  No results found for: PROTIME, INR, APTT    HCG (If Applicable) No results found for: PREGTESTUR, PREGSERUM, HCG, HCGQUANT     ABGs   No results found for: PHART, PO2ART, PCO2ART, HCO3ART, BEART, O2SATART     Type & Screen (If Applicable)  No results found for: [facility]  Active Problem  List with ICD10 Codes  Patient Active Problem List   Diagnosis Code   ??? Small bowel obstruction (HCC) K56.69   ??? Incisional hernia K43.2       Virgilio Frees, MD  September 19, 2014  7:54 AM      Surgeon:    Procedure:    Medications prior to admission:   Prior to Admission medications    Medication Sig Start Date End Date Taking? Authorizing Provider   polyethylene glycol (GLYCOLAX) powder Take 17 g by mouth daily   Yes Historical Provider, MD   gabapentin (NEURONTIN) 600 MG tablet Take 300 mg by mouth 2 times daily    Yes Historical Provider, MD   rOPINIRole (REQUIP) 1 MG tablet Take 3 mg by mouth nightly    Yes Historical Provider, MD   ibuprofen (ADVIL;MOTRIN) 800 MG tablet Take 1 tablet by mouth every 6 hours as needed for Pain. 02/12/14 02/17/14  Griffin Basil, PA       Current medications:    Current Outpatient Prescriptions   Medication Sig Dispense Refill   ??? polyethylene glycol (GLYCOLAX) powder Take 17 g by mouth daily     ??? gabapentin (NEURONTIN) 600 MG tablet Take 300 mg by mouth 2 times daily      ??? rOPINIRole (REQUIP) 1 MG tablet Take 3 mg by mouth nightly      ??? ibuprofen (ADVIL;MOTRIN) 800 MG tablet Take 1 tablet by mouth every 6 hours as needed for Pain. 20 tablet 3     Current Facility-Administered Medications   Medication Dose Route Frequency Provider Last Rate Last Dose   ??? 0.9 % sodium chloride infusion   Intravenous Continuous Arman Filter, MD 125 mL/hr at 09/19/14 0645     ??? ceFAZolin (ANCEF) 2 g in dextrose 3% 50mL IVPB (duplex)  2 g  Intravenous On Call to OR Arman Filterobert Woodruff, MD           Allergies:  No Known Allergies    Problem List:    Patient Active Problem List   Diagnosis Code   ??? Small bowel obstruction (HCC) K56.69   ??? Incisional hernia K43.2       Past Medical History:        Diagnosis Date   ??? Diverticulitis    ??? Hernia    ??? Restless leg        Past Surgical History:        Procedure Laterality Date   ??? Hernia repair  2008   ??? Appendectomy     ??? Abdomen surgery       Bowel  removal       Social History:    History   Substance Use Topics   ??? Smoking status: Never Smoker    ??? Smokeless tobacco: Never Used   ??? Alcohol Use: No      Comment: social                                Counseling given: Not Answered      Vital Signs (Current):   Filed Vitals:    09/18/14 1245 09/19/14 0624   BP:  114/82   Pulse:  71   Temp:  97.7 ??F (36.5 ??C)   TempSrc:  Temporal   Resp:  16   Height: 6' (1.829 m) 6' (1.829 m)   Weight: 255 lb (115.667 kg) 254 lb 3 oz (115.299 kg)   SpO2:  98%                                              BP Readings from Last 3 Encounters:   09/19/14 114/82   09/17/14 115/77   05/25/14 136/78       NPO Status: Time of last liquid consumption: 0001                        Time of last solid consumption: 2100                        Date of last liquid consumption: 09/19/14                        Date of last solid food consumption: 09/18/14    BMI:   Wt Readings from Last 3 Encounters:   09/19/14 254 lb 3 oz (115.299 kg)   09/17/14 259 lb 1.6 oz (117.527 kg)   05/25/14 260 lb (117.935 kg)     Body mass index is 34.47 kg/(m^2).    Anesthesia Evaluation  Patient summary reviewed  Airway: Mallampati: II  TM distance: <3 FB   Neck ROM: limited  Mouth opening: < 3 FB Dental:          Pulmonary:negative ROS and normal exam  breath sounds clear to auscultation         Cardiovascular:negative ROS            Rhythm: regular  Rate: normal                 Neuro/Psych:  Comments: Restless Leg Syndrome.   GI/Hepatic/Renal:            Comments: Diverticulitis.   Endo/Other: negative ROS         Abdominal:                    Anesthesia Plan    ASA 2     general   (Glidescope.)  intravenous induction   Anesthetic plan and risks discussed with patient.    Plan discussed with CRNA.  BIS          Virgilio Frees, MD   09/19/2014

## 2014-10-09 MED ORDER — ROPINIROLE HCL 3 MG PO TABS
3 MG | ORAL_TABLET | Freq: Every evening | ORAL | Status: DC
Start: 2014-10-09 — End: 2014-11-08

## 2014-10-09 NOTE — Telephone Encounter (Signed)
Meds sent to pharmacy.

## 2014-10-09 NOTE — Telephone Encounter (Signed)
Pt called he needs a refill on requip 3 mg.  [facility] [address]

## 2014-11-08 ENCOUNTER — Ambulatory Visit
Admit: 2014-11-08 | Discharge: 2014-11-08 | Payer: BLUE CROSS/BLUE SHIELD | Attending: Family Medicine | Primary: Family Medicine

## 2014-11-08 DIAGNOSIS — G2581 Restless legs syndrome: Secondary | ICD-10-CM

## 2014-11-08 MED ORDER — ROPINIROLE HCL 4 MG PO TABS
4 MG | ORAL_TABLET | Freq: Every evening | ORAL | Status: DC
Start: 2014-11-08 — End: 2015-01-30

## 2014-11-08 NOTE — Progress Notes (Signed)
BP Readings from Last 1 Encounters:   09/19/14 110/70    Recheck if >140/90  No results found for: LABA1C  No results found for: LABMICR    Have you had your Pneumonia Vaccine N/A    Have you had a Colorectal Screening  yes, 2014    Have you had a Screening Mammogram  N/A    Have you seen any other physician or provider since your last visit no    Have you had any other diagnostic tests since your last visit? yes - x-ray labs

## 2014-11-08 NOTE — Progress Notes (Signed)
OFFICE PROGRESS NOTE      SUBJECTIVE:        Patient ID:   Jay Maxwell is a 44 y.o. male who presents for   Chief Complaint   Patient presents with   ??? Shoulder Pain     right wants referral    ??? Other     restless legs   ??? Results     x-rays           HPI:   Shoulder Pain: Patient complaints of right shoulder pain. This is evaluated as a personal injury. The pain is described as aching.  The onset of the pain was several years ago.  The pain occurs continuously and lasts 2 hours.  Location is global. Symptoms are aggravated by all activities. Symptoms are diminished by  rest.   Limited activities include: reaching, lifting. moderate stiffness is reported. 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  Restless Legs: Still having trouble despite the requip. Currently on 3 mg. The aching is mostly at night. He has to get up and walk around for him to have relief. Took his wife's trazadone to help him sleep, and that did help somewhat.          Prior to Admission medications    Medication Sig Start Date End Date Taking? Authorizing Provider   rOPINIRole (REQUIP) 4 MG tablet Take 1 tablet by mouth nightly 11/08/14 12/08/14 Yes Llana AlimentSara R Ani Deoliveira, MD   polyethylene glycol [facility](GLYCOLAX) powder Take 17 g by mouth daily   Yes Historical Provider, MD   ibuprofen (ADVIL;MOTRIN) 800 MG tablet Take 1 tablet by mouth every 6 hours as needed for Pain. 02/12/14 02/17/14  Griffin Basilenee M Rouweyha, PA     History     Social History   ??? Marital Status: Married     Spouse Name: N/A     Number of Children: N/A   ??? Years of Education: N/A     Social History Main Topics   ??? Smoking status: Never Smoker    ??? Smokeless tobacco: Never Used   ??? Alcohol Use: No      Comment: social   ??? Drug Use: No   ??? Sexual Activity:     Partners: Female     Other Topics Concern   ??? None     Social History Narrative       I have reviewed Alexio's allergies, medications, problem list, medical, social and family history and have updated as  needed in the electronic medical record???  Review Of Systems:    Skin: no abnormal pigmentation, rash, scaling, itching, masses, hair or nail changes  Eyes: no blurring, diplopia, or eye pain  Ears/Nose/Throat: no hearing loss, tinnitus, vertigo, nosebleed, nasal congestion, rhinorrhea, sore throat  Respiratory: no cough, pleuritic chest pain, dyspnea, or wheezing  Cardiovascular: no chest pain, angina, dyspnea on exertion, orthopnea, PND, palpitations, or claudication  Gastrointestinal: no nausea, vomiting, heartburn, diarrhea, constipation, bloating,  abdominal pain, or blood per rectum.Appetite is good  Genitourinary: no urinary urgency, frequency, dysuria, nocturia, hesitancy, or incontinence  Musculoskeletal: joint pains off and on. Morning stiffness. Ambulating well  Neurologic: no paralysis, paresis, paresthesia, seizures, tremors, or headaches  Hematologic/Lymphatic/Immunologic: no anemia, abnormal bleeding/bruising, fever, chills, night sweats, swollen glands, or unexplained weight loss  Endocrine: no heat or cold intolerance and no polyphagia, polydipsia, or polyuria        OBJECTIVE:     VS:  Wt Readings from  Last 3 Encounters:   11/08/14 263 lb 6.4 oz (119.477 kg)   09/19/14 254 lb 3 oz (115.299 kg)   09/17/14 259 lb 1.6 oz (117.527 kg)                   Filed Vitals:    11/08/14 1121   BP: 130/88   Pulse: 73   Temp: 97.9 ??F (36.6 ??C)   Resp: 16   SpO2: 96%         General appearance: Alert, Awake, Oriented times 3, no distress  Skin: Warm and dry  Head: Normocephalic. No masses, lesions or tenderness noted  Neck: Neck supple. No bruit  Lungs: Lungs clear to auscultation bilaterally.  No ronchi, crackles or wheezes  Heart: S1 S2  Regular rate and rhythm. No rub, murmur or gallop  Abdomen: Abdomen soft, non-tender. BS normal. No masses, organomegaly  Extremities: No edema.  Pedal pulses are normal. No foot lesions.        Jay Maxwell was seen today for shoulder pain, other and results.    Diagnoses and  associated orders for this visit:    Restless leg  - rOPINIRole (REQUIP) 4 MG tablet; Take 1 tablet by mouth nightly    Acromioclavicular arthrosis, right  - [facility]- Glori Bickers, DO    Right shoulder pain  - [facility]- Glori Bickers, DO        -increase requip to 4 mg nightly  -if sx do not resolve, consider switching to neupro patch  Return in about 3 months (around 02/07/2015) for Shoulder pain, restless legs.           I have reviewed my findings and recommendations with Jay Loges.    Llana Aliment, M.D

## 2014-11-21 NOTE — Telephone Encounter (Signed)
Please have him schedule an appt with me so we can discuss further work up.

## 2014-11-21 NOTE — Telephone Encounter (Signed)
PT CALLED AND STATED THAT THE REQUIP IS NOT WORKING.  HE WOULD LIKE TO KNOW IF THERE IS SOMETHING ELSE HE CAN TRY.  STATED HE GOT ABOUT AN HOUR OF SLEEP LAST NIGHT.  SAYS HE HAS BEEN SLEEPING OFF AND ON.  WOULD LIKE TO KNOW IF THERE IS ANY TESTING HE COULD HAVE DONE TO SEE IF IT IS NOT RESTLESS LEG SYNDROME AND MAYBE THERE IS SOMETHING ELSE WRONG WITH HIS LEG

## 2014-12-20 ENCOUNTER — Ambulatory Visit
Admit: 2014-12-20 | Discharge: 2014-12-20 | Payer: BLUE CROSS/BLUE SHIELD | Attending: Orthopaedic Surgery | Primary: Family Medicine

## 2014-12-20 ENCOUNTER — Inpatient Hospital Stay: Attending: Orthopaedic Surgery | Primary: Family Medicine

## 2014-12-20 ENCOUNTER — Ambulatory Visit: Admit: 2014-12-20 | Primary: Family Medicine

## 2014-12-20 DIAGNOSIS — M7541 Impingement syndrome of right shoulder: Secondary | ICD-10-CM

## 2014-12-20 DIAGNOSIS — M25511 Pain in right shoulder: Secondary | ICD-10-CM

## 2014-12-20 NOTE — Progress Notes (Signed)
Chief Complaint   Patient presents with   ??? Shoulder Pain     right shoulder pain x several months, but has been getting worse over the last few months.  The patient is right hand dominant.  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        Jay CyphersKenneth Maxwell is a 44 y.o. year old Caucasian male who is seen today  for evaluation of right shoulder pain.  he reports the pain has been ongoing for the past several years.  he does not recall a specific injury which started the pain.  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   he reports the pain is worse with activity, better with rest.  The patient does have mechanical symptoms.   he does have night pain.  he denies a feeling of instability.  The prior treatments have been none.  The patient   has not responded to the treatment. The patient is right hand dominant. The patient is working. 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       Chief Complaint   Patient presents with   ??? Shoulder Pain     right shoulder pain x several months, but has been getting worse over the last few months.  The patient is right hand dominant.  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     Past Medical History   Diagnosis Date   ??? Diverticulitis    ??? Hernia    ??? Restless leg      Past Surgical History   Procedure Laterality Date   ??? Hernia repair  2008   ??? Appendectomy     ??? Abdomen surgery       Bowel removal   ??? Other surgical history  09/19/2014     laparoscopic incisional hernia repair with mesh       Current outpatient prescriptions:   ???  rOPINIRole (REQUIP) 4 MG tablet, Take 1 tablet by mouth nightly, Disp: 30 tablet, Rfl: 3  ???  polyethylene glycol (GLYCOLAX) powder, Take 17 g by mouth daily, Disp: , Rfl:   ???  ibuprofen (ADVIL;MOTRIN) 800 MG tablet, Take 1 tablet by mouth every 6 hours as needed for Pain., Disp: 20 tablet, Rfl: 3  No Known Allergies  History     Social History   ??? Marital Status: Married     Spouse Name: N/A      Number of Children: N/A   ??? Years of Education: N/A     Occupational History   ??? Not on file.     Social History Main Topics   ??? Smoking status: Never Smoker    ??? Smokeless tobacco: Never Used   ??? Alcohol Use: No      Comment: social   ??? Drug Use: No   ??? Sexual Activity:     Partners: Female     Other Topics Concern   ??? Not on file     Social History Narrative     Family History   Problem Relation Age of Onset   ??? Heart Disease Mother    ??? High Blood Pressure Mother    ??? Diabetes Mother    ??? High Blood Pressure Father    ??? Diabetes Father    ??? Heart Disease Father        REVIEW OF SYSTEMS:     General/Constitution:  (-)weight loss, (-)fever, (-)  chills, (-)weakness.   Skin: (-) rash,(-) psoriasis,(-) eczema, (-)skin cancer.   Musculoskeletal: (-) fractures,  (-) dislocations,(-) collagen vascular disease, (-) fibromyalgia, (-) multiple sclerosis, (-) muscular dystrophy, (-) RSD,(-) joint pain (-)swelling, (-) joint pain,swelling.  Neurologic: (-) epilepsy, (-)seizures,(-) brain tumor,(-) TIA, (-)stroke, (-)headaches, (-)Parkinson disease,(-) memory loss, (-) LOC.  Cardiovascular: (-) Chest pain, (-) swelling in legs/feet, (-) SOB, (-) cramping in legs/feet with walking.  Respiratory: (-) SOB, (-) Coughing, (-) night sweats.  GI: (-) nausea, (-) vomiting, (-) diarrhea, (-) blood in stool, (-) gastric ulcer.  Psychiatric: (-) Depression, (-) Anxiety, (-) bipolar disease, (-) Alzheimer's Disease  Allergic/Immunologic: (-) allergies latex, (-) allergies metal, (-) skin sensitivity.  Hematlogic: (-) anemia, (-) blood transfusion, (-) DVT/PE, (-) Clotting disorders      Subjective:       Constitution:    Ht. 6 ft 0 in. , Wt. 255 lbs.    Psycihatric:    The patient is alert and oriented x 3, appears to be stated age and in no distress.      Respiratory:    Respiratory effort is not labored.  Patient is not gasping.  Palpation of the chest reveals no tactile fremitus.    Skin:    Upon inspection: the skin appears warm, dry  and intact.  There is not a previous scar over the affected area.There is not any cellulitis, lymphedema or cutaneous lesions noted in the lower extremities.   Upon palpation there is no induration noted.          Neurologic:      Motor exam of the upper extremities show: The reflexes in biceps/triceps/brachioradialis are equal and symmetric.  Sensory exam C5-T1 are normal bilaterally.          Cardiovascular:    The vascular exam is normal and is well perfused to distal extremities.There are 2+ radial pulses bilaterally, and motor and sensation is intact to median, ulnar, and radial, musclocutaneus, and axillary nerve distribution and grossly symmetric bilaterally.  There is cap refill noted less than two seconds in all digits. There is not edema of the bilateral upper extremities.  There is not varicosities noted in the distal extremities.      Lymph:    Upon palpation,  there is no lymphadenopathy noted in bilateral upper extremities.      Musculoskeletal:    Gait: antalgic; examination of the nails and digits reveal no cyanosis or clubbing.      Cervical Exam:    On physical exam, Jay Maxwell is well-developed, well-nourished, oriented to person, place and time.  his gait is normal.  On evaluation of his cervical spine, he has full range of motion of the cervical spine without pain.  There is no cervical tenderness to palpation.     Shoulder Exam:     On evaluation of his bilaterally upper extremities, his right shoulder has no deformity.  There is tenderness upon palpation of the posterior shoulder.  There is not evidence of scapular dyskinesis.  There is not muscle atrophy in shoulder girdle. The range of motion for the Right Shoulder is 140/35/BL and for the Left shoulder is 160/45/T12.  Right shoulder Motor strength is 4+/5 in the supraspinatus, 5/5 internal rotation and 5-/5 in external rotation, and Left shoulder motor strength 5/5 in supraspinatus, 5/5 in internal rotation, 5/5 in external rotation.         Right shoulder:  positive Impingement , positive Hawkins ,positive  Speeds,negative  Apprehension ,negative Obrien Load  Shift, negative Sperling manuver, negative Cross arm test.     Left shoulder:  negative Impingement , negative Hawkins ,negative  Speeds,negative  Apprehension ,negative Obrien Load Shift, negative Sperling manuver, negative Cross arm test.     XRAY:    There is normal glenohumeral and acromioclavicular alignment.   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 The humeral head is normal in   contour. There is no evidence of fracture or dislocation. The   visualized ribs and lung are unremarkable.              Radiographic findings reviewed with patient    Impression:   Encounter Diagnoses   Name Primary?   ??? Complete tear of right rotator cuff    ??? Shoulder impingement, right Yes       Plan: Natural history and expected course discussed. Questions answered.  Agricultural engineer distributed.  Reduction in offending activity.  MRI.

## 2014-12-29 ENCOUNTER — Inpatient Hospital Stay: Admit: 2014-12-29 | Attending: Orthopaedic Surgery | Primary: Family Medicine

## 2014-12-29 DIAGNOSIS — M75121 Complete rotator cuff tear or rupture of right shoulder, not specified as traumatic: Secondary | ICD-10-CM

## 2015-01-03 ENCOUNTER — Ambulatory Visit
Admit: 2015-01-03 | Discharge: 2015-01-03 | Payer: BLUE CROSS/BLUE SHIELD | Attending: Orthopaedic Surgery | Primary: Family Medicine

## 2015-01-03 DIAGNOSIS — M7541 Impingement syndrome of right shoulder: Secondary | ICD-10-CM

## 2015-01-03 NOTE — Progress Notes (Signed)
Chief Complaint   Patient presents with   ??? Shoulder Pain     right shoulder pain, follow up MRI        Jay Maxwell is a 44 y.o. year old Caucasian male who is seen today  for follow up of his right shoulder pain.  He recently underwent an MRI of his right shoulder and is here for the results.      Chief Complaint   Patient presents with   ??? Shoulder Pain     right shoulder pain, follow up MRI     Past Medical History   Diagnosis Date   ??? Diverticulitis    ??? Hernia    ??? Restless leg    ??? Arthritis of right acromioclavicular joint 01/04/2015     Past Surgical History   Procedure Laterality Date   ??? Hernia repair  2008   ??? Appendectomy     ??? Abdomen surgery       Bowel removal   ??? Other surgical history  09/19/2014     laparoscopic incisional hernia repair with mesh       Current outpatient prescriptions:   ???  rOPINIRole (REQUIP) 4 MG tablet, Take 1 tablet by mouth nightly, Disp: 30 tablet, Rfl: 3  ???  polyethylene glycol (GLYCOLAX) powder, Take 17 g by mouth daily, Disp: , Rfl:   ???  ibuprofen (ADVIL;MOTRIN) 800 MG tablet, Take 1 tablet by mouth every 6 hours as needed for Pain., Disp: 20 tablet, Rfl: 3  No Known Allergies  History     Social History   ??? Marital Status: Married     Spouse Name: N/A     Number of Children: N/A   ??? Years of Education: N/A     Occupational History   ??? Not on file.     Social History Main Topics   ??? Smoking status: Never Smoker    ??? Smokeless tobacco: Never Used   ??? Alcohol Use: No      Comment: social   ??? Drug Use: No   ??? Sexual Activity:     Partners: Female     Other Topics Concern   ??? Not on file     Social History Narrative     Family History   Problem Relation Age of Onset   ??? Heart Disease Mother    ??? High Blood Pressure Mother    ??? Diabetes Mother    ??? High Blood Pressure Father    ??? Diabetes Father    ??? Heart Disease Father        REVIEW OF SYSTEMS:     General/Constitution:  (-)weight loss, (-)fever, (-)chills, (-)weakness.   Skin: (-) rash,(-) psoriasis,(-) eczema, (-)skin  cancer.   Musculoskeletal: (-) fractures,  (-) dislocations,(-) collagen vascular disease, (-) fibromyalgia, (-) multiple sclerosis, (-) muscular dystrophy, (-) RSD,(-) joint pain (-)swelling, (-) joint pain,swelling.  Neurologic: (-) epilepsy, (-)seizures,(-) brain tumor,(-) TIA, (-)stroke, (-)headaches, (-)Parkinson disease,(-) memory loss, (-) LOC.  Cardiovascular: (-) Chest pain, (-) swelling in legs/feet, (-) SOB, (-) cramping in legs/feet with walking.  Respiratory: (-) SOB, (-) Coughing, (-) night sweats.  GI: (-) nausea, (-) vomiting, (-) diarrhea, (-) blood in stool, (-) gastric ulcer.  Psychiatric: (-) Depression, (-) Anxiety, (-) bipolar disease, (-) Alzheimer's Disease  Allergic/Immunologic: (-) allergies latex, (-) allergies metal, (-) skin sensitivity.  Hematlogic: (-) anemia, (-) blood transfusion, (-) DVT/PE, (-) Clotting disorders      Subjective:       Constitution:  Ht. 6 ft 0 in. , Wt. 255 lbs.    Psycihatric:    The patient is alert and oriented x 3, appears to be stated age and in no distress.      Respiratory:    Respiratory effort is not labored.  Patient is not gasping.  Palpation of the chest reveals no tactile fremitus.    Skin:    Upon inspection: the skin appears warm, dry and intact.  There is not a previous scar over the affected area.There is not any cellulitis, lymphedema or cutaneous lesions noted in the lower extremities.   Upon palpation there is no induration noted.          Neurologic:      Motor exam of the upper extremities show: The reflexes in biceps/triceps/brachioradialis are equal and symmetric.  Sensory exam C5-T1 are normal bilaterally.          Cardiovascular:    The vascular exam is normal and is well perfused to distal extremities.There are 2+ radial pulses bilaterally, and motor and sensation is intact to median, ulnar, and radial, musclocutaneus, and axillary nerve distribution and grossly symmetric bilaterally.  There is cap refill noted less than two seconds in  all digits. There is not edema of the bilateral upper extremities.  There is not varicosities noted in the distal extremities.      Lymph:    Upon palpation,  there is no lymphadenopathy noted in bilateral upper extremities.      Musculoskeletal:    Gait: antalgic; examination of the nails and digits reveal no cyanosis or clubbing.      Cervical Exam:    On physical exam, Jay Maxwell is well-developed, well-nourished, oriented to person, place and time.  his gait is normal.  On evaluation of his cervical spine, he has full range of motion of the cervical spine without pain.  There is no cervical tenderness to palpation.     Shoulder Exam:     On evaluation of his bilaterally upper extremities, his right shoulder has no deformity.  There is tenderness upon palpation of the posterior shoulder.  There is not evidence of scapular dyskinesis.  There is not muscle atrophy in shoulder girdle. The range of motion for the Right Shoulder is 140/35/BL and for the Left shoulder is 160/45/T12.  Right shoulder Motor strength is 4+/5 in the supraspinatus, 5/5 internal rotation and 5-/5 in external rotation, and Left shoulder motor strength 5/5 in supraspinatus, 5/5 in internal rotation, 5/5 in external rotation.        Right shoulder:  positive Impingement , positive Hawkins ,positive  Speeds,negative  Apprehension ,negative Obrien Load Shift, negative Sperling manuver, negative Cross arm test.     Left shoulder:  negative Impingement , negative Hawkins ,negative  Speeds,negative  Apprehension ,negative Obrien Load Shift, negative Sperling manuver, negative Cross arm test.     XRAY:    There is normal glenohumeral and acromioclavicular alignment.   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 The humeral head is normal in   contour. There is no evidence of fracture or dislocation. The   visualized ribs and lung are unremarkable.        MRI:  1. Near full-thickness tear of the supraspinatus tendon from the   musculotendinous junction to  the tendinous insertion. A few intact   fibers remain. No evidence of retraction or fatty atrophy.   2. Moderately severe acromioclavicular arthrosis with pannus   formation, cancellous reactive edema and free fluid.  3. Free fluid within the subscapularis recess, the subacromial and   subdeltoid bursa, joint space and biceps tendinous sheath.   4. Degenerative changes and/or tears involving the anterior,   posterior and inferior labra.           Radiographic findings reviewed with patient    Impression:   Encounter Diagnoses   Name Primary?   ??? Shoulder impingement, right Yes   ??? Arthritis of right acromioclavicular joint        Plan:   HEP   I will proceed with a cortisone injection in the Right shoulder.    Consent was obtained for the injection.  Skin was prepped with alcohol and betadine.  1 cc of Kenalog 40 and 9 cc of 0.25% Marcaine was  injected to Right  Shoulder.  The injection was given through the posterior aspect of Right shoulder into the subacromial space.    Consent was obtained by the patient.  Skin was prepped with alcohol and betadine. 0.25 cc of Kenalog 40 and 0.5cc of 0.25% Marcaine was  injected to Right   The injection was given through the anterior aspect of Right shoulder into the acromial clavicular  joint.  The patient tolerated the injection well.     The patient tolerated the injections well. I will see the patient back as needed  Follow up as needed

## 2015-01-04 MED ORDER — TRIAMCINOLONE ACETONIDE 40 MG/ML IJ SUSP
40 MG/ML | Freq: Once | INTRAMUSCULAR | Status: AC
Start: 2015-01-04 — End: 2015-01-04
  Administered 2015-01-04: 15:00:00 12 mg via INTRA_ARTICULAR

## 2015-01-04 MED ORDER — TRIAMCINOLONE ACETONIDE 40 MG/ML IJ SUSP
40 MG/ML | Freq: Once | INTRAMUSCULAR | Status: AC
Start: 2015-01-04 — End: 2015-01-04
  Administered 2015-01-04: 15:00:00 40 mg via INTRA_ARTICULAR

## 2015-01-10 ENCOUNTER — Encounter: Attending: Family Medicine | Primary: Family Medicine

## 2015-01-30 ENCOUNTER — Encounter

## 2015-01-31 MED ORDER — ROPINIROLE HCL 4 MG PO TABS
4 MG | ORAL_TABLET | Freq: Every evening | ORAL | Status: DC
Start: 2015-01-31 — End: 2015-02-01

## 2015-02-01 ENCOUNTER — Encounter

## 2015-02-01 MED ORDER — ROPINIROLE HCL 4 MG PO TABS
4 MG | ORAL_TABLET | Freq: Every evening | ORAL | Status: DC
Start: 2015-02-01 — End: 2015-09-05

## 2015-06-20 ENCOUNTER — Ambulatory Visit
Admit: 2015-06-20 | Discharge: 2015-06-20 | Payer: BLUE CROSS/BLUE SHIELD | Attending: Orthopaedic Surgery | Primary: Family Medicine

## 2015-06-20 DIAGNOSIS — M19011 Primary osteoarthritis, right shoulder: Secondary | ICD-10-CM

## 2015-06-20 NOTE — Progress Notes (Signed)
Chief Complaint   Patient presents with   ??? Shoulder Pain     Right Shoulder pain. He had cortisone injection on 01/03/2015 and the pain has returned.        Jay Maxwell is a 44 y.o. year old Caucasian male who is seen today  for follow up of his right shoulder pain.  He was given a cortisone injection on 01/03/2015 and has some relief. He recently went bowling on vacation and the shoulder pain has returned. He states that he would like another injection in his right shoulder.      Chief Complaint   Patient presents with   ??? Shoulder Pain     Right Shoulder pain. He had cortisone injection on 01/03/2015 and the pain has returned.     Past Medical History   Diagnosis Date   ??? 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 Diverticulitis    ??? Hernia    ??? Restless leg      Past Surgical History   Procedure Laterality Date   ??? Hernia repair  2008   ??? Appendectomy     ??? Abdomen surgery       Bowel removal   ??? Other surgical history  09/19/2014     laparoscopic incisional hernia repair with mesh       Current Outpatient Prescriptions:   ???  rOPINIRole (REQUIP) 4 MG tablet, Take 1 tablet by mouth nightly, Disp: 90 tablet, Rfl: 3  ???  polyethylene glycol (GLYCOLAX) powder, Take 17 g by mouth daily, Disp: , Rfl:   ???  ibuprofen (ADVIL;MOTRIN) 800 MG tablet, Take 1 tablet by mouth every 6 hours as needed for Pain., Disp: 20 tablet, Rfl: 3  No Known Allergies  Social History     Social History   ??? Marital status: Married     Spouse name: N/A   ??? Number of children: N/A   ??? Years of education: N/A     Occupational History   ??? Not on file.     Social History Main Topics   ??? Smoking status: Never Smoker   ??? Smokeless tobacco: Never Used   ??? Alcohol use No      Comment: social   ??? Drug use: No   ??? Sexual activity: Yes     Partners: Female     Other Topics Concern   ??? Not on file     Social History Narrative     Family History   Problem Relation Age of Onset   ??? Heart Disease Mother    ??? High Blood Pressure Mother    ???  Diabetes Mother    ??? High Blood Pressure Father    ??? Diabetes Father    ??? Heart Disease Father        REVIEW OF SYSTEMS:     General/Constitution:  (-)weight loss, (-)fever, (-)chills, (-)weakness.   Skin: (-) rash,(-) psoriasis,(-) eczema, (-)skin cancer.   Musculoskeletal: (-) fractures,  (-) dislocations,(-) collagen vascular disease, (-) fibromyalgia, (-) multiple sclerosis, (-) muscular dystrophy, (-) RSD,(-) joint pain (-)swelling, (-) joint pain,swelling.  Neurologic: (-) epilepsy, (-)seizures,(-) brain tumor,(-) TIA, (-)stroke, (-)headaches, (-)Parkinson disease,(-) memory loss, (-) LOC.  Cardiovascular: (-) Chest pain, (-) swelling in legs/feet, (-) SOB, (-) cramping in legs/feet with walking.  Respiratory: (-) SOB, (-) Coughing, (-) night sweats.  GI: (-) nausea, (-) vomiting, (-) diarrhea, (-) blood in stool, (-) gastric ulcer.  Psychiatric: (-) Depression, (-) Anxiety, (-) bipolar disease, (-) Alzheimer's  Disease  Allergic/Immunologic: (-) allergies latex, (-) allergies metal, (-) skin sensitivity.  Hematlogic: (-) anemia, (-) blood transfusion, (-) DVT/PE, (-) Clotting disorders      Subjective:       Constitution:    Ht. 6 ft 0 in. , Wt. 255 lbs.    Psycihatric:    The patient is alert and oriented x 3, appears to be stated age and in no distress.      Respiratory:    Respiratory effort is not labored.  Patient is not gasping.  Palpation of the chest reveals no tactile fremitus.    Skin:    Upon inspection: the skin appears warm, dry and intact.  There is not a previous scar over the affected area.There is not any cellulitis, lymphedema or cutaneous lesions noted in the lower extremities.   Upon palpation there is no induration noted.          Neurologic:      Motor exam of the upper extremities show: The reflexes in biceps/triceps/brachioradialis are equal and symmetric.  Sensory exam C5-T1 are normal bilaterally.          Cardiovascular:    The vascular exam is normal and is well perfused to distal  extremities.There are 2+ radial pulses bilaterally, and motor and sensation is intact to median, ulnar, and radial, musclocutaneus, and axillary nerve distribution and grossly symmetric bilaterally.  There is cap refill noted less than two seconds in all digits. There is not edema of the bilateral upper extremities.  There is not varicosities noted in the distal extremities.      Lymph:    Upon palpation,  there is no lymphadenopathy noted in bilateral upper extremities.      Musculoskeletal:    Gait: antalgic; examination of the nails and digits reveal no cyanosis or clubbing.      Cervical Exam:    On physical exam, Jay Maxwell is well-developed, well-nourished, oriented to person, place and time.  his gait is normal.  On evaluation of his cervical spine, he has full range of motion of the cervical spine without pain.  There is no cervical tenderness to palpation.     Shoulder Exam:     On evaluation of his bilaterally upper extremities, his right shoulder has no deformity.  There is tenderness upon palpation of the posterior shoulder.  There is not evidence of scapular dyskinesis.  There is not muscle atrophy in shoulder girdle. The range of motion for the Right Shoulder is 140/35/BL and for the Left shoulder is 160/45/T12.  Right shoulder Motor strength is 4+/5 in the supraspinatus, 5/5 internal rotation and 5-/5 in external rotation, and Left shoulder motor strength 5/5 in supraspinatus, 5/5 in internal rotation, 5/5 in external rotation.        Right shoulder:  positive Impingement , positive Hawkins ,positive  Speeds,negative  Apprehension ,negative Obrien Load Shift, negative Sperling manuver, negative Cross arm test.     Left shoulder:  negative Impingement , negative Hawkins ,negative  Speeds,negative  Apprehension ,negative Obrien Load Shift, negative Sperling manuver, negative Cross arm test.     XRAY:    There is normal glenohumeral and acromioclavicular alignment.   There is severe AC joint  arthritis. The humeral head is normal in   contour. There is no evidence of fracture or dislocation. The   visualized ribs and lung are unremarkable.        MRI:  1. Near full-thickness tear of the supraspinatus tendon from the  musculotendinous junction to the tendinous insertion. A few intact   fibers remain. No evidence of retraction or fatty atrophy.   2. Moderately severe acromioclavicular arthrosis with pannus   formation, cancellous reactive edema and free fluid.   3. Free fluid within the subscapularis recess, the subacromial and   subdeltoid bursa, joint space and biceps tendinous sheath.   4. Degenerative changes and/or tears involving the anterior,   posterior and inferior labra.           Radiographic findings reviewed with patient    Impression:   Encounter Diagnoses   Name Primary?   ??? Arthritis of right acromioclavicular joint Yes   ??? Shoulder impingement, right        Plan:   HEP   I will proceed with a cortisone injection in the Right shoulder.    Consent was obtained for the injection.  Skin was prepped with alcohol and betadine.  1 cc of Kenalog 40 and 9 cc of 0.25% Marcaine was  injected to Right  Shoulder.  The injection was given through the posterior aspect of Right shoulder into the subacromial space.    Consent was obtained by the patient.  Skin was prepped with alcohol and betadine. 0.25 cc of Kenalog 40 and 0.5cc of 0.25% Marcaine was  injected to Right   The injection was given through the anterior aspect of Right shoulder into the acromial clavicular  joint.  The patient tolerated the injection well.     The patient tolerated the injections well. I will see the patient back as needed  Follow up as needed

## 2015-07-18 ENCOUNTER — Inpatient Hospital Stay: Admit: 2015-07-18 | Discharge: 2015-07-18 | Disposition: A | Discharge status: Home / Self Care

## 2015-07-18 DIAGNOSIS — J0100 Acute maxillary sinusitis, unspecified: Secondary | ICD-10-CM

## 2015-07-18 MED ORDER — AMOXICILLIN 875 MG PO TABS
875 MG | ORAL_TABLET | Freq: Two times a day (BID) | ORAL | 0 refills | Status: AC
Start: 2015-07-18 — End: 2015-07-28

## 2015-07-18 MED ORDER — LORATADINE-PSEUDOEPHEDRINE ER 10-240 MG PO TB24
10-240 MG | ORAL_TABLET | Freq: Every day | ORAL | 0 refills | Status: DC
Start: 2015-07-18 — End: 2015-09-05

## 2015-07-18 MED ORDER — IBUPROFEN 800 MG PO TABS
800 MG | Freq: Once | ORAL | Status: AC
Start: 2015-07-18 — End: 2015-07-18
  Administered 2015-07-18: 04:00:00 800 mg via ORAL

## 2015-07-18 MED ORDER — AMOXICILLIN 250 MG PO CAPS
250 MG | Freq: Once | ORAL | Status: AC
Start: 2015-07-18 — End: 2015-07-18
  Administered 2015-07-18: 04:00:00 500 mg via ORAL

## 2015-07-18 MED ORDER — IBUPROFEN 800 MG PO TABS
800 MG | ORAL_TABLET | Freq: Four times a day (QID) | ORAL | 3 refills | Status: DC | PRN
Start: 2015-07-18 — End: 2015-09-05

## 2015-07-18 MED ORDER — BENZONATATE 100 MG PO CAPS
100 MG | ORAL_CAPSULE | Freq: Three times a day (TID) | ORAL | 0 refills | Status: AC | PRN
Start: 2015-07-18 — End: 2015-07-25

## 2015-07-18 MED FILL — IBUPROFEN 800 MG PO TABS: 800 MG | ORAL | Qty: 1

## 2015-07-18 MED FILL — AMOXICILLIN 250 MG PO CAPS: 250 MG | ORAL | Qty: 2

## 2015-07-18 NOTE — ED Provider Notes (Signed)
Independent MLP  HPI:  07/18/15, Time: 12:02 AM         Jay Maxwell is a 44 y.o. male presenting to the ED for cough , beginning 2 days  ago.  The complaint has been persistent, moderate in severity, and worsened by nothing.  Patient comes in with complaint of sinus congestion congestion and bilateral ear pain  hoarsness of the throatdenies pain. On states he has a dry cough no shortness of breath denies any fever chills.    Review of Systems:   Pertinent positives and negatives are stated within HPI, all other systems reviewed and are negative.          --------------------------------------------- PAST HISTORY ---------------------------------------------  Past Medical History:  has a past medical history of Arthritis of right acromioclavicular joint; Diverticulitis; Hernia; and Restless leg.    Past Surgical History:  has a past surgical history that includes hernia repair (2008); Appendectomy; Abdomen surgery; and other surgical history (09/19/2014).    Social History:  reports that he has never smoked. He has never used smokeless tobacco. He reports that he does not drink alcohol or use illicit drugs.    Family History: family history includes Diabetes in his father and mother; Heart Disease in his father and mother; High Blood Pressure in his father and mother.     The patient???s home medications have been reviewed.    Allergies: Review of patient's allergies indicates no known allergies.    -------------------------------------------------- RESULTS -------------------------------------------------  All laboratory and radiology results have been personally reviewed by myself   LABS:  No results found for this visit on 07/18/15.    RADIOLOGY:  Interpreted by Radiologist.  No orders to display       ------------------------- NURSING NOTES AND VITALS REVIEWED ---------------------------   The nursing notes within the ED encounter and vital signs as below have been reviewed.   Visit Vitals   ??? BP 138/50   ???  Pulse 82   ??? Temp 98 ??F (36.7 ??C) (Oral)   ??? Resp 16   ??? Ht 6' (1.829 m)   ??? Wt 255 lb (115.7 kg)   ??? SpO2 100%   ??? BMI 34.58 kg/m2     Oxygen Saturation Interpretation: Normal      ---------------------------------------------------PHYSICAL EXAM--------------------------------------      Constitutional/General: Alert and oriented x3, well appearing, non toxic in NAD  Head: Normocephalic and atraumatic  Eyes: PERRL, EOMI  Tender over maxillary sinuses  Ears right TM with erythema slight erythema of the canal left ear TM without erythema or inflammation  Mouth: Oropharynx clear, handling secretions, no trismus No erythema of the pharynx no tonsillar swelling or exudate uvula is midline  Neck: Supple, full ROM,   Pulmonary: Lungs clear to auscultation bilaterally, no wheezes, rales, or rhonchi. Not in respiratory distress  Cardiovascular:  Regular rate and rhythm, no murmurs, gallops, or rubs. 2+ distal pulses  Abdomen: Soft, non tender, non distended,   Extremities: Moves all extremities x 4. Warm and well perfused  Skin: warm and dry without rash  Neurologic: GCS 15,  Psych: Normal Affect      ------------------------------ ED COURSE/MEDICAL DECISION MAKING----------------------  Medications   ibuprofen (ADVIL;MOTRIN) tablet 800 mg (800 mg Oral Given 07/18/15 0020)   amoxicillin (AMOXIL) capsule 500 mg (500 mg Oral Given 07/18/15 0019)         ED COURSE:  ED Course       Medical Decision Making:   Patients placed on amoxicillin and Motrin  Claritin-D Tessalon Perles follow-up with primary care 1-2 days  Counseling:   The emergency provider has spoken with the patient and discussed today???s results, in addition to providing specific details for the plan of care and counseling regarding the diagnosis and prognosis.  Questions are answered at this time and they are agreeable with the plan.      --------------------------------- IMPRESSION AND DISPOSITION ---------------------------------    IMPRESSION  1. Acute maxillary  sinusitis, recurrence not specified    2. Acute serous otitis media, recurrence not specified, unspecified laterality        DISPOSITION  Disposition: Discharge to home  Patient condition is good      NOTE: This report was transcribed using voice recognition software. Every effort was made to ensure accuracy; however, inadvertent computerized transcription errors may be present     Griffin Basil, [place]  07/18/15 0015      ATTENDING PROVIDER ATTESTATION:     Supervising Physician, on-site, available for consultation, non-participatory in the evaluation or care of this patient.         Ladon Applebaum, DO  08/16/15 [phone]

## 2015-07-18 NOTE — Discharge Instructions (Signed)
Sinusitis: Care Instructions  Your Care Instructions     Sinusitis is an infection of the lining of the sinus cavities in your head. Sinusitis often follows a cold. It causes pain and pressure in your head and face.  In most cases, sinusitis gets better on its own in 1 to 2 weeks. But some mild symptoms may last for several weeks. Sometimes antibiotics are needed.  Follow-up care is a key part of your treatment and safety. Be sure to make and go to all appointments, and call your doctor if you are having problems. It's also a good idea to know your test results and keep a list of the medicines you take.  How can you care for yourself at home?   Take an over-the-counter pain medicine, such as acetaminophen (Tylenol), ibuprofen (Advil, Motrin), or naproxen (Aleve). Read and follow all instructions on the label.   If the doctor prescribed antibiotics, take them as directed. Do not stop taking them just because you feel better. You need to take the full course of antibiotics.   Be careful when taking over-the-counter cold or flu medicines and Tylenol at the same time. Many of these medicines have acetaminophen, which is Tylenol. Read the labels to make sure that you are not taking more than the recommended dose. Too much acetaminophen (Tylenol) can be harmful.   Breathe warm, moist air from a steamy shower, a hot bath, or a sink filled with hot water. Avoid cold, dry air. Using a humidifier in your home may help. Follow the directions for cleaning the machine.   Use saline (saltwater) nasal washes to help keep your nasal passages open and wash out mucus and bacteria. You can buy saline nose drops at a grocery store or drugstore. Or you can make your own at home by adding 1 teaspoon of salt and 1 teaspoon of baking soda to 2 cups of distilled water. If you make your own, fill a bulb syringe with the solution, insert the tip into your nostril, and squeeze gently. Blow your nose.   Put a hot, wet towel or a warm  gel pack on your face 3 or 4 times a day for 5 to 10 minutes each time.   Try a decongestant nasal spray like oxymetazoline (Afrin). Do not use it for more than 3 days in a row. Using it for more than 3 days can make your congestion worse.  When should you call for help?  Call your doctor now or seek immediate medical care if:   You have new or worse swelling or redness in your face or around your eyes.   You have a new or higher fever.  Watch closely for changes in your health, and be sure to contact your doctor if:   You have new or worse facial pain.   The mucus from your nose becomes thicker (like pus) or has new blood in it.   You are not getting better as expected.  Where can you learn more?  Go to [email or web address] and sign in to your MyChart account. Enter I933 in the Search Health Information box to learn more about "Sinusitis: Care Instructions."    If you do not have an account, please click on the "Sign Up Now" link.   2006-2016 [employer]. Care instructions adapted under license by [facility]. This care instruction is for use with your licensed healthcare professional. If you have questions about a medical condition or this instruction, always ask your healthcare professional. [employer],   Incorpor[employer] disclaims any warranty or liability for your use of this information.  Content Version: 10.9.538570; Current as of: September 14, 2014        Ear Infection (Otitis Media): Care Instructions  Your Care Instructions     An ear infection may start with a cold and affect the middle ear (otitis media). It can hurt a lot. Most ear infections clear up on their own in a couple of days. Most often you will not need antibiotics. This is because many ear infections are caused by a virus. Antibiotics don't work against a virus. Regular doses of pain medicines are the best way to reduce your fever and help you feel better.  Follow-up care is a key part of your treatment and  safety. Be sure to make and go to all appointments, and call your doctor if you are having problems. It's also a good idea to know your test results and keep a list of the medicines you take.  How can you care for yourself at home?   Take pain medicines exactly as directed.   If the doctor gave you a prescription medicine for pain, take it as prescribed.   If you are not taking a prescription pain medicine, take an over-the-counter medicine, such as acetaminophen (Tylenol), ibuprofen (Advil, Motrin), or naproxen (Aleve). Read and follow all instructions on the label.   Do not take two or more pain medicines at the same time unless the doctor told you to. Many pain medicines have acetaminophen, which is Tylenol. Too much acetaminophen (Tylenol) can be harmful.   Plan to take a full dose of pain reliever before bedtime. Getting enough sleep will help you get better.   Try a warm, moist washcloth on the ear. It may help relieve pain.   If your doctor prescribed antibiotics, take them as directed. Do not stop taking them just because you feel better. You need to take the full course of antibiotics.  When should you call for help?  Call your doctor now or seek immediate medical care if:   You have new or increasing ear pain.   You have new or increasing pus or blood draining from your ear.   You have a fever with a stiff neck or a severe headache.  Watch closely for changes in your health, and be sure to contact your doctor if:   You have new or worse symptoms.   You are not getting better after taking an antibiotic for 2 days.  Where can you learn more?  Go to [email or web address] and sign in to your MyChart account. Enter [id number] in the Search Health Information box to learn more about "Ear Infection (Otitis Media): Care Instructions."    If you do not have an account, please click on the "Sign Up Now" link.   2006-2016 [employer]. Care instructions adapted under license by  [facility]. This care instruction is for use with your licensed healthcare professional. If you have questions about a medical condition or this instruction, always ask your healthcare professional. [employer] disclaims any warranty or liability for your use of this information.  Content Version: 10.9.538570; Current as of: September 14, 2014

## 2015-09-05 MED ORDER — PRAMIPEXOLE DIHYDROCHLORIDE 0.125 MG PO TABS
0.125 MG | ORAL_TABLET | Freq: Every evening | ORAL | 3 refills | Status: DC
Start: 2015-09-05 — End: 2015-09-30

## 2015-09-05 NOTE — Progress Notes (Signed)
Annual exam:  Patient is here for routine yearly physical/preventative visit and to establish care. .  Patient has  complaints of ongoing cough for the last two months.  He reports the cough is usually dry but he does produce mucous on occasion.  He has coughing spells daily which are worse at night.  He tries Nyquil and other cough syrups without relief. He was given an antibiotic by the ER at the end of September but cough did not improve.  He denies frequent heartburn but does get it on occasion.  He also has complaints of RLS and has been on max dose of requip for some time.  He reports this is not working and he is having trouble sleeping at night.      Patient is  generally healthy.  Chronic medical problems include RLS, bulging discs of the lumbar spine.  .  BP 134/82 today.  Most recent labs reviewed with patient and  are not remarkable.  Health maintenance reviewed with patient and is not up to date.  Patient does not smoke.  Patient does not drink alcohol.  Patient  does not use drugs.  Overall doing well.      Patient's past medical, surgical, social and/or family history reviewed, updated in chart, and are non-contributory (unless otherwise stated).  Medications and allergies also reviewed and updated in chart.      Visit Vitals   ??? BP 134/82   ??? Pulse 70   ??? Temp 97.6 ??F (36.4 ??C) (Tympanic)   ??? Ht 6' (1.829 m)   ??? Wt 254 lb (115.2 kg)   ??? SpO2 97%   ??? BMI 34.45 kg/m2       Review of Systems   Constitutional: Positive for fatigue. Negative for activity change, appetite change, chills, diaphoresis, fever and unexpected weight change.   HENT: Positive for tinnitus (occassional right ear ). Negative for congestion, dental problem, drooling, ear discharge, ear pain, facial swelling, hearing loss, mouth sores, nosebleeds, postnasal drip, rhinorrhea, sinus pressure, sneezing, sore throat, trouble swallowing and voice change.    Eyes: Negative.    Respiratory: Positive for cough, chest tightness and shortness  of breath. Negative for apnea, choking, wheezing and stridor.         Comes and goes  Does not notice any triggers    Cardiovascular: Negative for chest pain, palpitations and leg swelling.   Gastrointestinal: Negative.    Endocrine: Negative.    Genitourinary: Negative.    Musculoskeletal: Positive for arthralgias (right shoulder bad rotater cuff ) and back pain (buldging disk lower back ).   Allergic/Immunologic: Negative for environmental allergies, food allergies and immunocompromised state.   Neurological: Positive for dizziness and light-headedness. Negative for seizures and syncope.        With coughing spells    Hematological: Negative.    Psychiatric/Behavioral: Positive for sleep disturbance (due to rls).       Physical Exam   Constitutional: He is oriented to person, place, and time. He appears well-developed and well-nourished. No distress.   HENT:   Head: Normocephalic and atraumatic.   Right Ear: External ear normal.   Left Ear: External ear normal.   Nose: Nose normal.   Mouth/Throat: Oropharynx is clear and moist. No oropharyngeal exudate.   Eyes: Conjunctivae and EOM are normal. Pupils are equal, round, and reactive to light. Right eye exhibits no discharge. Left eye exhibits no discharge. No scleral icterus.   Neck: Normal range of motion. Neck supple. No JVD  present. No tracheal deviation present. No thyromegaly present.   Cardiovascular: Normal rate, regular rhythm and normal heart sounds.  Exam reveals no gallop and no friction rub.    No murmur heard.  Pulmonary/Chest: Effort normal and breath sounds normal. No stridor. No respiratory distress. He has no wheezes. He has no rales. He exhibits no tenderness.   Abdominal: Soft. Bowel sounds are normal. He exhibits distension. There is no tenderness. There is no rebound and no guarding.   Musculoskeletal: He exhibits tenderness (right shoulder/limited rom ). He exhibits no edema or deformity.   Lymphadenopathy:     He has no cervical adenopathy.    Neurological: He is alert and oriented to person, place, and time. No cranial nerve deficit. Coordination normal.   Skin: Skin is warm and dry. He is not diaphoretic.   Psychiatric: He has a normal mood and affect. His behavior is normal. Judgment and thought content normal.   Nursing note and vitals reviewed.      Jay Maxwell was seen today for established new doctor, cough, leg pain and health maintenance.    Diagnoses and all orders for this visit:    Cough  -     XR Chest Standard TWO VW; Future               Discussed possible GERD being reason for chronic cough he is going to try OTC Prilosec for two weeks    Restless leg  -     pramipexole (MIRAPEX) 0.125 MG tablet; Take 1 tablet by mouth nightly  -     CBC Auto Differential; Future  -     Iron and TIBC; Future  -     Comprehensive Metabolic Panel; Future    Other fatigue  -     CBC Auto Differential; Future  -     Iron and TIBC; Future  -     Comprehensive Metabolic Panel; Future    Diabetes mellitus screening  -     Hemoglobin A1C; Future    Screening cholesterol level  -     Lipid Panel; Future    Need for prophylactic vaccination against diphtheria-tetanus-pertussis (DTP)  -     Tdap (Adacel or Boostrix); use instead of Td  if age >= 71 y and no prior Tdap        Return in about 4 weeks (around 10/03/2015)., or sooner if necessary.      Educational materials and/or home exercises printed for patient's review and were included in patient instructions on his/her After Visit Summary and given to patient at the end of visit.      Counseled regarding above diagnosis, including possible risks and complications,  especially if left uncontrolled.    Counseled regarding the possible side effects, risks, benefits and alternatives to treatment; patient and/or guardian verbalizes understanding, agrees, feels comfortable with and wishes to proceed with above treatment plan.    Advised patient to call with any new medication issues, and read all Rx info from pharmacy to  assure aware of all possible risks and side effects of medication before taking.    Reviewed age and gender appropriate health screening exams and vaccinations.  Advised patient regarding importance of keeping up with recommended health maintenance and to schedule as soon as possible if overdue, as this is important in assessing for undiagnosed pathology, especially cancer, as well as protecting against potentially harmful/life threatening disease.        Patient and/or guardian verbalizes understanding and agrees  with above counseling, assessment and plan.    All questions answered.    Electronically signed by Alicia AmelJoni Buratti, CNP on 09/05/2015 at 10:14 AM

## 2015-09-05 NOTE — Patient Instructions (Addendum)
Cough: Care Instructions  Your Care Instructions  A cough is your body's response to something that bothers your throat or airways. Many things can cause a cough. You might cough because of a cold or the flu, bronchitis, or asthma. Smoking, postnasal drip, allergies, and stomach acid that backs up into your throat also can cause coughs.  A cough is a symptom, not a disease. Most coughs stop when the cause, such as a cold, goes away. You can take a few steps at home to cough less and feel better.  Follow-up care is a key part of your treatment and safety. Be sure to make and go to all appointments, and call your doctor if you are having problems. It's also a good idea to know your test results and keep a list of the medicines you take.  How can you care for yourself at home?  ?? Drink lots of water and other fluids. This helps thin the mucus and soothes a dry or sore throat. Honey or lemon juice in hot water or tea may ease a dry cough.  ?? Take cough medicine as directed by your doctor.  ?? Prop up your head on pillows to help you breathe and ease a dry cough.  ?? Try cough drops to soothe a dry or sore throat. Cough drops don't stop a cough. Medicine-flavored cough drops are no better than candy-flavored drops or hard candy.  ?? Do not smoke. Avoid secondhand smoke. If you need help quitting, talk to your doctor about stop-smoking programs and medicines. These can increase your chances of quitting for good.  When should you call for help?  Call 911 anytime you think you may need emergency care. For example, call if:  ?? You have severe trouble breathing.  Call your doctor now or seek immediate medical care if:  ?? You cough up blood.  ?? You have new or worse trouble breathing.  ?? You have a new or higher fever.  ?? You have a new rash.  Watch closely for changes in your health, and be sure to contact your doctor if:  ?? You cough more deeply or more often, especially if you notice more mucus or a change in the color of  your mucus.  ?? You have new symptoms, such as a sore throat, an earache, or sinus pain.  ?? You do not get better as expected.  Where can you learn more?  Go to [email or web address] and sign in to your MyChart account. Enter D279 in the Search Health Information box to learn more about ???Cough: Care Instructions.???    If you do not have an account, please click on the ???Sign Up Now??? link.  ?? 2006-2016 [employer]. Care instructions adapted under license by [facility]. This care instruction is for use with your licensed healthcare professional. If you have questions about a medical condition or this instruction, always ask your healthcare professional. Health[employer], Inc[employer] disclaims any warranty or liability for your use of this information.  Content Version: 11.0.578772; Current as of: Mar 22, 2015        Chronic Cough: Care Instructions  Your Care Instructions     A cough is your body's response to something that bothers your throat or airways. Many things can cause a cough. You might cough because of a cold or the flu, bronchitis, or asthma. Smoking, postnasal drip, allergies, and stomach acid that backs up into your throat also can cause a cough.  A cough can be short-term (  acute) or long-term (chronic). A chronic cough lasts more than 3 weeks. A chronic cough is often caused by a long-term problem, such as asthma. Another cause might be a medicine, such as an ACE inhibitor.  A cough is a symptom, not a disease. To treat a chronic cough, you may need to treat the problem that causes it. You can take a few steps at home to cough less and feel better.  Some people cough or clear their throat out of habit for no clear reason.  Follow-up care is a key part of your treatment and safety. Be sure to make and go to all appointments, and call your doctor if you are having problems. It's also a good idea to know your test results and keep a list of the medicines you take.  How can you care  for yourself at home?  ?? Drink plenty of water and other fluids. This may help soothe a dry or sore throat. Honey or lemon juice in hot water or tea may ease a dry cough.  ?? Prop up your head on pillows to help you breathe and ease a cough.  ?? Do not smoke or allow others to smoke around you. Smoke can make a cough worse. If you need help quitting, talk to your doctor about stop-smoking programs and medicines. These can increase your chances of quitting for good.  ?? Avoid exposure to smoke, dust, or other pollutants, or wear a face mask. Check with your doctor or pharmacist to find out which type of face mask will give you the most benefit.  ?? Take cough medicine as directed by your doctor.  ?? Try cough drops to soothe a dry or sore throat. Cough drops don't stop a cough. Medicine-flavored cough drops are no better than candy-flavored drops or hard candy.  Throat clearing  When you have a chronic cough or a disease that may cause this type of cough, you may often feel like you want to clear your throat. This helps bring up mucus. But throat clearing does not always have a cause.  Throat clearing can become a habit. The more you do it, the more you feel like you need to do it. But frequent throat clearing can be hard on your vocal cords. It's like slamming them together.  To help lessen throat clearing, you can try:  ?? Taking small sips of water.  ?? Not clearing your throat when you feel you need to.  ?? Swallowing hard when you want to clear your throat.  You may want to ask your doctor if a medicine that thins mucus would help.  When should you call for help?  Call 911 anytime you think you may need emergency care. For example, call if:  ?? You have severe trouble breathing.  Call your doctor now or seek immediate medical care if:  ?? You cough up blood.  ?? You have new or worse trouble breathing.  ?? You have a new or higher fever.  Watch closely for changes in your health, and be sure to contact your doctor  if:  ?? You cough more deeply or more often, especially if you notice more mucus or a change in the color of your mucus.  ?? You do not get better as expected.  Where can you learn more?  Go to [email or web address] and sign in to your MyChart account. Enter [phone] in the Search Health Information box to learn more about ???Chronic Cough: Care Instructions.???  If you do not have an account, please click on the ???Sign Up Now??? link.  ?? 2006-2016 [employer], [employer]. Care instructions adapted under license by [facility]. This care instruction is for use with your licensed healthcare professional. If you have questions about a medical condition or this instruction, always ask your healthcare professional. [employer] disclaims any warranty or liability for your use of this information.  Content Version: 11.0.578772; Current as of: Mar 18, 2015        Restless Legs Syndrome: Care Instructions  Your Care Instructions  Restless legs syndrome is a common nervous system problem. People with this syndrome feel a creeping, achy, or unpleasant feeling in the legs and an overpowering urge to move them. It often occurs in the evening and at night and can lead to sleep problems and tiredness.  Your doctor may suggest doing a study of your sleep patterns to figure out what is happening when you try to sleep. Many people get relief from symptoms when they get regular exercise, eat well, and avoid caffeine, alcohol, and tobacco.  Follow-up care is a key part of your treatment and safety. Be sure to make and go to all appointments, and call your doctor if you are having problems. It's also a good idea to know your test results and keep a list of the medicines you take.  How can you care for yourself at home?  ?? Take your medicines exactly as prescribed. Call your doctor if you think you are having a problem with your medicine.  ?? Try bathing in hot or cold water. Applying a heating pad or ice bag to  your legs may also help symptoms.  ?? Stretch and massage your legs before bed or when discomfort begins.  ?? Get some exercise for at least 30 minutes a day on most days of the week. Stop exercising at least 3 hours before bedtime.  ?? Try to plan for situations where you will need to remain seated for long stretches. For example, if you are traveling by car, plan some stops so you can get out and walk around.  ?? Tell your doctor about any medicines you are taking. This includes all over-the-counter, prescription, and herbal medicines. Some medicines, such as antidepressants, antihistamines, and cold and sinus medicines, can make your symptoms worse.  ?? Avoid caffeine products, such as coffee, tea, cola, and chocolate. Caffeine can interrupt your sleep and stimulate you.  ?? Do not smoke. Nicotine can make restless legs worse. If you need help quitting, talk to your doctor about stop-smoking programs and medicines. These can increase your chances of quitting for good.  ?? Do not drink alcohol late in the evening.  Take steps to help you sleep better  ?? Get plenty of sunlight in the outdoors, particularly later in the afternoon.  ?? Use the evening hours for settling down. Avoid activities that challenge you in the hours before bedtime.  ?? Eat meals at regular times, and do not snack before bedtime.  ?? Keep your bedroom quiet, dark, and cool. Try using a sleep mask and earplugs to help you sleep.  ?? Limit how much you drink at night to reduce your need to get up to urinate. But do not go to bed thirsty.  ?? Run a fan or other steady "white noise" during the night if noises wake you up.  ?? Reserve the bed for sleeping and sex. Do your reading or TV watching in another room.  ?? Once  you are in bed, relax from head to toe, and guide your mind to pleasant thoughts.  ?? Do not stay in bed longer than 8 hours, and try to avoid naps.  ?? If your symptoms usually improve around 4 a.m. to 6 a.m., try going to bed later than usual or  allowing extra time for sleeping in to help you get the rest you need.  When should you call for help?  Watch closely for changes in your health, and be sure to contact your doctor if:  ?? You are still not getting enough sleep.  ?? Your symptoms become more severe or happen more often.  Where can you learn more?  Go to [email or web address] and sign in to your MyChart account. Enter [phone] in the Search Health Information box to learn more about ???Restless Legs Syndrome: Care Instructions.???    If you do not have an account, please click on the ???Sign Up Now??? link.  ?? 2006-2016 [employer], [employer]. Care instructions adapted under license by [facility]. This care instruction is for use with your licensed healthcare professional. If you have questions about a medical condition or this instruction, always ask your healthcare professional. [employer], [employer] disclaims any warranty or liability for your use of this information.  Content Version: 11.0.578772; Current as of: December 14, 2014

## 2015-09-13 ENCOUNTER — Encounter: Primary: Family Medicine

## 2015-09-30 ENCOUNTER — Telehealth

## 2015-09-30 MED ORDER — PRAMIPEXOLE DIHYDROCHLORIDE 0.5 MG PO TABS
0.5 MG | ORAL_TABLET | Freq: Every evening | ORAL | 3 refills | Status: DC
Start: 2015-09-30 — End: 2015-10-17

## 2015-09-30 NOTE — Telephone Encounter (Signed)
I increased his dose of Mirapex to 0.5 MG nightly for the RLS.  I did not prescribe the Gabapentin.  He was a new patient to us therefore we would need to see him again for this to be prescribed.  Also would need a release of his records to establish where he had this medication prescribed in the past.

## 2015-09-30 NOTE — Telephone Encounter (Signed)
Patient called today and stated that the Mirapex 0.125mg  was not helping.  He did double the medicine and is taking the Gabapentin 800mg  @ bedtime.  This does seem to be helping the RLS.  Can we call in the increase of the Mirapex and also the Gabapentin?

## 2015-10-03 ENCOUNTER — Encounter: Attending: Family | Primary: Family Medicine

## 2015-10-11 ENCOUNTER — Encounter: Payer: BLUE CROSS/BLUE SHIELD | Attending: Family | Primary: Family Medicine

## 2015-10-17 ENCOUNTER — Ambulatory Visit: Admit: 2015-10-17 | Discharge: 2015-10-17 | Payer: BLUE CROSS/BLUE SHIELD | Attending: Family | Primary: Family Medicine

## 2015-10-17 DIAGNOSIS — S46911A Strain of unspecified muscle, fascia and tendon at shoulder and upper arm level, right arm, initial encounter: Secondary | ICD-10-CM

## 2015-10-17 MED ORDER — PRAMIPEXOLE DIHYDROCHLORIDE 0.5 MG PO TABS
0.5 MG | ORAL_TABLET | Freq: Every evening | ORAL | 1 refills | Status: DC
Start: 2015-10-17 — End: 2016-05-06

## 2015-10-17 MED ORDER — DEXAMETHASONE SODIUM PHOSPHATE 4 MG/ML IJ SOLN
4 MG/ML | Freq: Once | INTRAMUSCULAR | Status: AC
Start: 2015-10-17 — End: 2015-10-17
  Administered 2015-10-17: 17:00:00 4 mg via INTRAMUSCULAR

## 2015-10-17 MED ORDER — METHYLPREDNISOLONE 4 MG PO TBPK
4 MG | PACK | ORAL | 0 refills | Status: AC
Start: 2015-10-17 — End: 2015-10-23

## 2015-10-17 MED ORDER — GABAPENTIN 300 MG PO CAPS
300 MG | ORAL_CAPSULE | Freq: Every evening | ORAL | 3 refills | Status: DC
Start: 2015-10-17 — End: 2016-07-20

## 2015-10-17 MED ORDER — IBUPROFEN 800 MG PO TABS
800 MG | ORAL_TABLET | Freq: Three times a day (TID) | ORAL | 3 refills | Status: DC | PRN
Start: 2015-10-17 — End: 2016-07-20

## 2015-10-17 NOTE — Progress Notes (Signed)
SUBJECTIVE  Jay Maxwell is a 44 y.o. male.    HPI/Chief C/O:  Chief Complaint   Patient presents with   ??? Medication Refill   ??? Shoulder Pain     right shoulder is causing pain and not able to lift objects- has had issues for 4 days   ??? Health Maintenance     reviewed; declined flu vac     No Known Allergies  HPI Comments: Reports he has had some improvement with the RLS on the higher dose of Mirapex. Has had gabapentin in the past and is wondering if the combination of the two would be beneficial.      Shoulder Pain    The pain is present in the right shoulder. This is a new problem. The current episode started in the past 7 days. There has been no history of extremity trauma. The problem occurs constantly. The problem has been unchanged. The quality of the pain is described as aching. The pain is mild (moderate with activity ). Associated symptoms include a limited range of motion and tingling. Pertinent negatives include no fever, inability to bear weight, joint swelling or numbness. The symptoms are aggravated by activity, lying down and contact. He has tried nothing for the symptoms. bad rotator cuff sees dr Jay Maxwell         ROS:  Review of Systems   Constitutional: Negative.  Negative for fever.   Respiratory: Negative.    Cardiovascular: Negative.    Gastrointestinal: Negative.    Genitourinary: Negative.    Musculoskeletal: Positive for joint pain. Negative for myalgias.   Skin: Negative.    Neurological: Positive for tingling. Negative for dizziness, focal weakness and numbness.        Rls     Past Medical/Surgical Hx;  Reviewed with patient      Diagnosis Date   ??? 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 Diverticulitis    ??? Hernia    ??? Restless leg      Past Surgical History   Procedure Laterality Date   ??? Hernia repair  2008   ??? Appendectomy     ??? Abdomen surgery       Bowel removal   ??? Other surgical history  09/19/2014     laparoscopic incisional hernia repair with mesh       Past  Family Hx:  Reviewed with patient      Problem Relation Age of Onset   ??? Heart Disease Mother    ??? High Blood Pressure Mother    ??? Diabetes Mother    ??? High Blood Pressure Father    ??? Diabetes Father    ??? Heart Disease Father        Social Hx:  Reviewed with patient  Social History   Substance Use Topics   ??? Smoking status: Never Smoker   ??? Smokeless tobacco: Never Used   ??? Alcohol use No      Comment: social       OBJECTIVE  BP 124/74  Pulse 78  Ht 6' (1.829 m)  Wt 264 lb (119.7 kg)  SpO2 96%  BMI 35.8 kg/m2    Problem List:  Jay Maxwell  does not have any pertinent problems on file.    PHYS EX:  Physical Exam   Constitutional: He is oriented to person, place, and time. He appears well-developed and well-nourished. No distress.   HENT:   Head: Normocephalic and atraumatic.   Right Ear: External ear normal.  Left Ear: External ear normal.   Eyes: Conjunctivae and EOM are normal. Pupils are equal, round, and reactive to light. Right eye exhibits no discharge. Left eye exhibits no discharge. No scleral icterus.   Neck: Normal range of motion. Neck supple.   Cardiovascular: Normal rate, regular rhythm and normal heart sounds.  Exam reveals no gallop and no friction rub.    No murmur heard.  Pulmonary/Chest: Effort normal and breath sounds normal. No respiratory distress. He has no wheezes. He has no rales. He exhibits no tenderness.   Abdominal: Soft. Bowel sounds are normal. He exhibits distension. There is no tenderness. There is no rebound and no guarding.   Musculoskeletal: He exhibits tenderness (right shoulder/limited rom ). He exhibits no edema or deformity.   Neurological: He is alert and oriented to person, place, and time. No cranial nerve deficit. Coordination normal.   Skin: Skin is warm and dry. He is not diaphoretic.   Psychiatric: He has a normal mood and affect. His behavior is normal. Judgment and thought content normal.   Nursing note and vitals reviewed.    ASSESSMENT/PLAN  Jay Maxwell was seen today for  medication refill, shoulder pain and health maintenance.    Diagnoses and all orders for this visit:    Right shoulder strain, initial encounter  -     dexamethasone (DECADRON) injection 4 mg; Inject 1 mL into the muscle once  -     methylPREDNISolone (MEDROL DOSEPACK) 4 MG tablet; Take as directed  -     ibuprofen (ADVIL;MOTRIN) 800 MG tablet; Take 1 tablet by mouth every 8 hours as needed for Pain    Restless leg  -     pramipexole (MIRAPEX) 0.5 MG tablet; Take 1 tablet by mouth nightly  -     gabapentin (NEURONTIN) 300 MG capsule; Take 1 capsule by mouth nightly    Other orders  -     Cancel: gabapentin (NEURONTIN) 600 MG tablet; Take 0.5 tablets by mouth 2 times daily        Outpatient Encounter Prescriptions as of 10/17/2015   Medication Sig Dispense Refill   ??? pramipexole (MIRAPEX) 0.5 MG tablet Take 1 tablet by mouth nightly 90 tablet 1   ??? gabapentin (NEURONTIN) 300 MG capsule Take 1 capsule by mouth nightly 30 capsule 3   ??? methylPREDNISolone (MEDROL DOSEPACK) 4 MG tablet Take as directed 1 kit 0   ??? ibuprofen (ADVIL;MOTRIN) 800 MG tablet Take 1 tablet by mouth every 8 hours as needed for Pain 90 tablet 3   ??? [DISCONTINUED] pramipexole (MIRAPEX) 0.5 MG tablet Take 1 tablet by mouth nightly 30 tablet 3     Facility-Administered Encounter Medications as of 10/17/2015   Medication Dose Route Frequency Provider Last Rate Last Dose   ??? dexamethasone (DECADRON) injection 4 mg  4 mg Intramuscular Once Jay Duchesne L Buratti, CNP           Return in about 6 months (around 04/16/2016), or if symptoms worsen or fail to improve.            Reviewed recent labs related to Jay Maxwell current problems      Discussed importance of regular Health Maintenance follow up  Health Maintenance   Topic   ??? HIV screen    ??? Lipid screen    ??? Diabetes screen    ??? Flu vaccine (1)   ??? DTaP/Tdap/Td vaccine (2 - Td)             Jay La, CNP

## 2015-10-17 NOTE — Patient Instructions (Addendum)
Restless Legs Syndrome: Care Instructions  Your Care Instructions  Restless legs syndrome is a common nervous system problem. People with this syndrome feel a creeping, achy, or unpleasant feeling in the legs and an overpowering urge to move them. It often occurs in the evening and at night and can lead to sleep problems and tiredness.  Your doctor may suggest doing a study of your sleep patterns to figure out what is happening when you try to sleep. Many people get relief from symptoms when they get regular exercise, eat well, and avoid caffeine, alcohol, and tobacco.  Follow-up care is a key part of your treatment and safety. Be sure to make and go to all appointments, and call your doctor if you are having problems. It's also a good idea to know your test results and keep a list of the medicines you take.  How can you care for yourself at home?  ?? Take your medicines exactly as prescribed. Call your doctor if you think you are having a problem with your medicine.  ?? Try bathing in hot or cold water. Applying a heating pad or ice bag to your legs may also help symptoms.  ?? Stretch and massage your legs before bed or when discomfort begins.  ?? Get some exercise for at least 30 minutes a day on most days of the week. Stop exercising at least 3 hours before bedtime.  ?? Try to plan for situations where you will need to remain seated for long stretches. For example, if you are traveling by car, plan some stops so you can get out and walk around.  ?? Tell your doctor about any medicines you are taking. This includes all over-the-counter, prescription, and herbal medicines. Some medicines, such as antidepressants, antihistamines, and cold and sinus medicines, can make your symptoms worse.  ?? Avoid caffeine products, such as coffee, tea, cola, and chocolate. Caffeine can interrupt your sleep and stimulate you.  ?? Do not smoke. Nicotine can make restless legs worse. If you need help quitting, talk to your doctor about  stop-smoking programs and medicines. These can increase your chances of quitting for good.  ?? Do not drink alcohol late in the evening.  Take steps to help you sleep better  ?? Get plenty of sunlight in the outdoors, particularly later in the afternoon.  ?? Use the evening hours for settling down. Avoid activities that challenge you in the hours before bedtime.  ?? Eat meals at regular times, and do not snack before bedtime.  ?? Keep your bedroom quiet, dark, and cool. Try using a sleep mask and earplugs to help you sleep.  ?? Limit how much you drink at night to reduce your need to get up to urinate. But do not go to bed thirsty.  ?? Run a fan or other steady "white noise" during the night if noises wake you up.  ?? Reserve the bed for sleeping and sex. Do your reading or TV watching in another room.  ?? Once you are in bed, relax from head to toe, and guide your mind to pleasant thoughts.  ?? Do not stay in bed longer than 8 hours, and try to avoid naps.  ?? If your symptoms usually improve around 4 a.m. to 6 a.m., try going to bed later than usual or allowing extra time for sleeping in to help you get the rest you need.  When should you call for help?  Watch closely for changes in your health, and be sure   to contact your doctor if:  ?? You are still not getting enough sleep.  ?? Your symptoms become more severe or happen more often.  Where can you learn more?  Go to [email or web address] and sign in to your MyChart account. Enter X429 in the Search Health Information box to learn more about ???Restless Legs Syndrome: Care Instructions.???    If you do not have an account, please click on the ???Sign Up Now??? link.  ?? 2006-2016 [employer], [employer]. Care instructions adapted under license by [facility]. This care instruction is for use with your licensed healthcare professional. If you have questions about a medical condition or this instruction, always ask your healthcare professional. [employer],  [employer]corpor[employer] disclaims any warranty or liability for your use of this information.  Content Version: 11.0.578772; Current as of: December 14, 2014

## 2015-10-31 ENCOUNTER — Encounter: Attending: Orthopaedic Surgery | Primary: Family Medicine

## 2015-12-14 ENCOUNTER — Inpatient Hospital Stay
Admit: 2015-12-14 | Discharge: 2015-12-14 | Disposition: A | Discharge status: Home / Self Care | Attending: Emergency Medicine

## 2015-12-14 ENCOUNTER — Encounter: Admit: 2015-12-14 | Primary: Family Medicine

## 2015-12-14 DIAGNOSIS — J069 Acute upper respiratory infection, unspecified: Secondary | ICD-10-CM

## 2015-12-14 LAB — CBC WITH AUTO DIFFERENTIAL
Basophils %: 0.2 % (ref 0.0–2.0)
Basophils Absolute: 0.01 E9/L (ref 0.00–0.20)
Eosinophils %: 0 % (ref 0.0–6.0)
Eosinophils Absolute: 0 E9/L — ABNORMAL LOW (ref 0.05–0.50)
Hematocrit: 49 % (ref 37.0–54.0)
Hemoglobin: 16.2 g/dL (ref 12.5–16.5)
Immature Granulocytes #: 0.02 E9/L
Immature Granulocytes %: 0.3 % (ref 0.0–5.0)
Lymphocytes %: 21.2 % (ref 20.0–42.0)
Lymphocytes Absolute: 1.36 E9/L — ABNORMAL LOW (ref 1.50–4.00)
MCH: 27.8 pg (ref 26.0–35.0)
MCHC: 33.1 % (ref 32.0–34.5)
MCV: 84 fL (ref 80.0–99.9)
MPV: 9.3 fL (ref 7.0–12.0)
Monocytes %: 9.4 % (ref 2.0–12.0)
Monocytes Absolute: 0.6 E9/L (ref 0.10–0.95)
Neutrophils %: 68.9 % (ref 43.0–80.0)
Neutrophils Absolute: 4.42 E9/L (ref 1.80–7.30)
Platelets: 226 E9/L (ref 130–450)
RBC: 5.83 E12/L — ABNORMAL HIGH (ref 3.80–5.80)
RDW: 13.2 fL (ref 11.5–15.0)
WBC: 6.4 E9/L (ref 4.5–11.5)

## 2015-12-14 LAB — EKG 12-LEAD
Atrial Rate: 75 {beats}/min
P Axis: 38 degrees
P-R Interval: 172 ms
Q-T Interval: 390 ms
QRS Duration: 82 ms
QTc Calculation (Bazett): 435 ms
R Axis: 2 degrees
T Axis: 15 degrees
Ventricular Rate: 75 {beats}/min

## 2015-12-14 LAB — BASIC METABOLIC PANEL
Anion Gap: 14 mmol/L (ref 7–16)
BUN: 12 mg/dL (ref 6–20)
CO2: 24 mmol/L (ref 22–29)
Calcium: 8.6 mg/dL (ref 8.6–10.2)
Chloride: 102 mmol/L (ref 98–107)
Creatinine: 1.1 mg/dL (ref 0.7–1.2)
GFR African American: >60
GFR Non-African American: >60 mL/min/{1.73_m2} (ref >=60)
Glucose: 114 mg/dL — ABNORMAL HIGH (ref 74–109)
Potassium: 3.2 mmol/L — ABNORMAL LOW (ref 3.5–5.0)
Sodium: 140 mmol/L (ref 132–146)

## 2015-12-14 LAB — PROTIME-INR
INR: 1.1
Protime: 12.7 s — ABNORMAL HIGH (ref 9.3–12.4)

## 2015-12-14 LAB — APTT: aPTT: 29 s (ref 24.5–35.1)

## 2015-12-14 MED ORDER — PROMETHAZINE HCL 25 MG/ML IJ SOLN
25 MG/ML | Freq: Once | INTRAMUSCULAR | Status: AC
Start: 2015-12-14 — End: 2015-12-14
  Administered 2015-12-14: 16:00:00 25 mg via INTRAMUSCULAR

## 2015-12-14 MED ORDER — PSEUDOEPH-BROMPHEN-DM 30-2-10 MG/5ML PO SYRP
2-30-10 MG/5ML | Freq: Four times a day (QID) | ORAL | 0 refills | Status: AC | PRN
Start: 2015-12-14 — End: 2015-12-19

## 2015-12-14 MED ORDER — SODIUM CHLORIDE 0.9 % IV BOLUS
0.9 % | Freq: Once | INTRAVENOUS | Status: AC
Start: 2015-12-14 — End: 2015-12-14
  Administered 2015-12-14: 16:00:00 1000 mL via INTRAVENOUS

## 2015-12-14 MED FILL — SODIUM CHLORIDE 0.9 % IV SOLN: 0.9 % | INTRAVENOUS | Qty: 1000

## 2015-12-14 MED FILL — PROMETHAZINE HCL 25 MG/ML IJ SOLN: 25 MG/ML | INTRAMUSCULAR | Qty: 1

## 2015-12-14 NOTE — ED Notes (Signed)
PT IS SLEEPING AND STATES PAIN IS SLIGHTLY BETTER WHEN AWAKE.INSTRUCTION TO PT PER DR CARDINAL.     Roshan Roback, LPN  60/45/40 [id number]

## 2015-12-14 NOTE — ED Provider Notes (Signed)
HPI Comments: Patient is a 45 year old male presenting with chief complaint of URI symptoms, cough, coughing up blood, diarrhea. Patient states his symptoms started 3 days ago after being at [facility] with his daughter, states he thinks he picked something up from there. He has been complaining of nasal congestion, runny nose, fevers, chills, cough, chest pain only with coughing, started to have some blood tinge to the sputum from coughing, decided come into the ER for evaluation. He also states diarrhea that started last night with 3 episodes since last night. Denies blood in the stool or dark tarry stools, abdominal pain, nausea, vomiting, lightheadedness, syncope, shortness of breath, lower extremity swelling or edema, prior history of blood clotting disorders, prior DVTs or PEs, recent surgeries, history of cancer. Symptoms have been intermittent since onset, no particular exacerbating or relieving factors for the symptoms. No treatment prior to arrival in the ER.    The history is provided by the patient.       Review of Systems   Constitutional: Negative for chills, diaphoresis, fatigue and fever.   HENT: Positive for congestion, postnasal drip and rhinorrhea. Negative for ear pain, nosebleeds, sinus pressure and sore throat.    Respiratory: Positive for cough. Negative for chest tightness, shortness of breath and wheezing.    Cardiovascular: Negative for chest pain, palpitations and leg swelling.   Gastrointestinal: Positive for diarrhea. Negative for abdominal pain, nausea and vomiting.   Genitourinary: Negative for dysuria, flank pain, frequency and urgency.   Musculoskeletal: Negative for arthralgias, back pain, gait problem, joint swelling, myalgias, neck pain and neck stiffness.   Skin: Negative for rash and wound.   Neurological: Negative for dizziness, seizures, syncope, weakness, light-headedness, numbness and headaches.   Hematological: Negative for adenopathy.   All other systems  reviewed and are negative.      Physical Exam   Constitutional: He is oriented to person, place, and time. He appears well-developed and well-nourished. He is active.  Non-toxic appearance. He does not have a sickly appearance. He does not appear ill. No distress.   Patient resting comfortably in bed, NAD   HENT:   Head: Normocephalic and atraumatic.   Right Ear: Hearing and tympanic membrane normal. Tympanic membrane is not erythematous and not bulging. No middle ear effusion.   Left Ear: Hearing and tympanic membrane normal. Tympanic membrane is not erythematous and not bulging.  No middle ear effusion.   Nose: Mucosal edema and rhinorrhea present.   Mouth/Throat: Uvula is midline, oropharynx is clear and moist and mucous membranes are normal. Mucous membranes are not pale and not dry. No oropharyngeal exudate, posterior oropharyngeal edema, posterior oropharyngeal erythema or tonsillar abscesses.   Eyes: Conjunctivae and EOM are normal. Pupils are equal, round, and reactive to light.   Neck: Normal range of motion. Neck supple.   Cardiovascular: Normal rate, regular rhythm, S1 normal, S2 normal, normal heart sounds and intact distal pulses.    No murmur heard.  Pulses:       Radial pulses are 2+ on the right side, and 2+ on the left side.        Dorsalis pedis pulses are 2+ on the right side, and 2+ on the left side.   Pulmonary/Chest: Effort normal and breath sounds normal. No accessory muscle usage. No respiratory distress. He has no decreased breath sounds. He has no wheezes. He has no rhonchi. He has no rales. He exhibits no tenderness.   Abdominal: Soft. Normal appearance and bowel sounds are normal.  He exhibits no distension, no ascites, no pulsatile midline mass and no mass. There is no tenderness. There is no rigidity, no rebound, no guarding, no CVA tenderness, no tenderness at McBurney's point and negative Murphy's sign.   Well healed midline surgical scar and R lower abdominal surgical scar, no  drainage or signs of infection   Musculoskeletal: Normal range of motion. He exhibits no edema or tenderness.   Neurological: He is alert and oriented to person, place, and time. He is not disoriented. He displays no atrophy, no tremor and normal reflexes. No cranial nerve deficit or sensory deficit. He exhibits normal muscle tone. He displays no seizure activity. Coordination normal. GCS eye subscore is 4. GCS verbal subscore is 5. GCS motor subscore is 6.   Skin: Skin is warm, dry and intact. No rash noted. He is not diaphoretic. No erythema.   Psychiatric: He has a normal mood and affect. His speech is normal and behavior is normal.   Nursing note and vitals reviewed.      Procedures    MDM     EKG:  This EKG is signed and interpreted by me.    Rate: 75  Rhythm: Sinus  Interpretation: Normal sinus rhythm, normal axis, normal intervals, no acute ST- or T-wave changes  Comparison: No prior EKGs on file    --------------------------------------------- PAST HISTORY ---------------------------------------------  Past Medical History:  has a past medical history of Arthritis of right acromioclavicular joint; Diverticulitis; Hernia; and Restless leg.    Past Surgical History:  has a past surgical history that includes hernia repair (2008); Appendectomy; Abdomen surgery; other surgical history (09/19/2014); and Tonsillectomy.    Social History:  reports that he has never smoked. He has never used smokeless tobacco. He reports that he does not drink alcohol or use illicit drugs.    Family History: family history includes Diabetes in his father and mother; Heart Disease in his father and mother; High Blood Pressure in his father and mother.     The patient???s home medications have been reviewed.    Allergies: Review of patient's allergies indicates no known allergies.    -------------------------------------------------- RESULTS -------------------------------------------------  Labs:  Results for orders placed or performed  during the hospital encounter of 12/14/15   CBC Auto Differential   Result Value Ref Range    WBC 6.4 4.5 - 11.5 E9/L    RBC 5.83 (H) 3.80 - 5.80 E12/L    Hemoglobin 16.2 12.5 - 16.5 g/dL    Hematocrit 96.0 45.4 - 54.0 %    MCV 84.0 80.0 - 99.9 fL    MCH 27.8 26.0 - 35.0 pg    MCHC 33.1 32.0 - 34.5 %    RDW 13.2 11.5 - 15.0 fL    Platelets 226 130 - 450 E9/L    MPV 9.3 7.0 - 12.0 fL    Neutrophils % 68.9 43.0 - 80.0 %    Lymphocytes Relative 21.2 20.0 - 42.0 %    Monocytes % 9.4 2.0 - 12.0 %    Eosinophils Relative Percent 0.0 0.0 - 6.0 %    Basophils % 0.2 0.0 - 2.0 %    Neutrophils # 4.42 1.80 - 7.30 E9/L    Lymphocytes # 1.36 (L) 1.50 - 4.00 E9/L    Monocytes # 0.60 0.10 - 0.95 E9/L    Eosinophils # 0.00 (L) 0.05 - 0.50 E9/L    Basophils # 0.01 0.00 - 0.20 E9/L    Immature Granulocytes % 0.3 0.0 - 5.0 %  Immature Granulocytes # 0.02 E9/L   Basic Metabolic Panel   Result Value Ref Range    Sodium 140 132 - 146 mmol/L    Potassium 3.2 (L) 3.5 - 5.0 mmol/L    Chloride 102 98 - 107 mmol/L    CO2 24 22 - 29 mmol/L    Anion Gap 14 7 - 16 mmol/L    Glucose 114 (H) 74 - 109 mg/dL    BUN 12 6 - 20 mg/dL    CREATININE 1.1 0.7 - 1.2 mg/dL    GFR Non-African American >60 >=60 mL/min/1.73    GFR African American >60     Calcium 8.6 8.6 - 10.2 mg/dL   Protime-INR   Result Value Ref Range    Protime 12.7 (H) 9.3 - 12.4 sec    INR 1.1    APTT   Result Value Ref Range    aPTT 29.0 24.5 - 35.1 sec       Radiology:  XR Chest Standard TWO VW   Final Result   Normal chest.             ------------------------- NURSING NOTES AND VITALS REVIEWED ---------------------------  Date / Time Roomed:  12/14/2015  9:53 AM  ED Bed Assignment:  14/14    The nursing notes within the ED encounter and vital signs as below have been reviewed.   Visit Vitals   ??? BP (!) 127/91   ??? Pulse 88   ??? Temp 98.1 ??F (36.7 ??C)   ??? Resp 20   ??? Ht 6' (1.829 m)   ??? Wt 271 lb 8 oz (123.2 kg)   ??? SpO2 98%   ??? BMI 36.82 kg/m2     Oxygen Saturation Interpretation:  Normal      ------------------------------------------ PROGRESS NOTES ------------------------------------------  I have spoken with the patient and discussed today???s results, in addition to providing specific details for the plan of care and counseling regarding the diagnosis and prognosis.  Their questions are answered at this time and they are agreeable with the plan. I discussed at length with them reasons for immediate return here for re evaluation. They will followup with primary care by calling their office tomorrow.      --------------------------------- ADDITIONAL PROVIDER NOTES ---------------------------------  At this time the patient is without objective evidence of an acute process requiring hospitalization or inpatient management.  They have remained hemodynamically stable throughout their entire ED visit and are stable for discharge with outpatient follow-up.     The plan has been discussed in detail and they are aware of the specific conditions for emergent return, as well as the importance of follow-up.      New Prescriptions    BROMPHENIRAMINE-PSEUDOEPHEDRINE-DM 30-2-10 MG/5ML SYRUP    Take 5 mLs by mouth 4 times daily as needed for Congestion or Cough       Diagnosis:  1. URI with cough and congestion    2. Bronchitis    3. Hemoptysis        Disposition:  Patient's disposition: Discharge to home  Patient's condition is stable.        ATTENDING PROVIDER ATTESTATION:     I have personally performed and/or participated in the history, exam, medical decision making, and procedures and agree with all pertinent clinical information unless otherwise noted.      I have also reviewed and agree with the past medical, family and social history unless otherwise noted.    I have discussed this patient in detail with  the resident, and provided the instruction and education regarding patient here complaining of 2-3 days of body aches, nasal congestion, cough and runny nose with intermittent productive cough,  has had some blood tinge in the sputum. Also has some nausea with dry heaves but no true vomiting and has had some mild diarrhea. No blood in the emesis or stool. No shortness of breath or chest pain. No leg pain or swelling. No history of blood clots. No recent traveling or surgeries. Has URI symptoms with this..  My findings/plan: patient resting comfortably in no acute distress. Nasal congestion noted. Lungs are clear and equal. Abdomen soft and nontender. No pretibial edema or calf pain. No adenopathy or meningeal signs. No joint effusions. No petechia or purpura.                 Estill Bamberg, DO  Resident  12/14/15 1150

## 2016-03-07 ENCOUNTER — Encounter

## 2016-03-11 ENCOUNTER — Encounter

## 2016-04-04 ENCOUNTER — Encounter

## 2016-05-06 ENCOUNTER — Encounter

## 2016-05-06 MED ORDER — PRAMIPEXOLE DIHYDROCHLORIDE 0.5 MG PO TABS
0.5 MG | ORAL_TABLET | ORAL | 1 refills | Status: DC
Start: 2016-05-06 — End: 2016-11-06

## 2016-05-15 ENCOUNTER — Encounter: Payer: BLUE CROSS/BLUE SHIELD | Attending: Family Medicine | Primary: Family Medicine

## 2016-05-22 ENCOUNTER — Encounter: Payer: BLUE CROSS/BLUE SHIELD | Attending: Family Medicine | Primary: Family Medicine

## 2016-06-07 ENCOUNTER — Encounter

## 2016-06-29 ENCOUNTER — Encounter

## 2016-07-20 ENCOUNTER — Ambulatory Visit
Admit: 2016-07-20 | Discharge: 2016-07-20 | Payer: BLUE CROSS/BLUE SHIELD | Attending: Family Medicine | Primary: Family Medicine

## 2016-07-20 DIAGNOSIS — J019 Acute sinusitis, unspecified: Secondary | ICD-10-CM

## 2016-07-20 MED ORDER — GUAIFENESIN ER 600 MG PO TB12
600 MG | ORAL_TABLET | Freq: Two times a day (BID) | ORAL | 0 refills | Status: DC
Start: 2016-07-20 — End: 2016-11-13

## 2016-07-20 MED ORDER — AMOXICILLIN 250 MG PO CAPS
250 MG | ORAL_CAPSULE | Freq: Three times a day (TID) | ORAL | 0 refills | Status: AC
Start: 2016-07-20 — End: 2016-07-30

## 2016-07-20 MED ORDER — GABAPENTIN 300 MG PO CAPS
300 MG | ORAL_CAPSULE | Freq: Every evening | ORAL | 1 refills | Status: DC
Start: 2016-07-20 — End: 2016-11-13

## 2016-07-20 MED ORDER — IBUPROFEN 800 MG PO TABS
800 MG | ORAL_TABLET | Freq: Three times a day (TID) | ORAL | 2 refills | Status: DC | PRN
Start: 2016-07-20 — End: 2016-11-13

## 2016-07-21 DIAGNOSIS — J019 Acute sinusitis, unspecified: Secondary | ICD-10-CM

## 2016-07-21 NOTE — Progress Notes (Signed)
SUBJECTIVE  Jay Maxwell is a 45 y.o. male.    HPI/Chief C/O:  Chief Complaint   Patient presents with   ??? Medication Refill   ??? Cough     pt stated he gets every year around this time   ??? Health Maintenance     Reviewed; due for flu     No Known Allergies  This 45 year old male presents with shoulder impringment, and RLS. Pt has nasal congestion, cough, and nausea.  ROS:  Review of Systems   Constitutional: Positive for malaise/fatigue. Negative for chills, diaphoresis, fever and weight loss.   HENT: Positive for congestion and sore throat. Negative for ear discharge, ear pain, hearing loss, nosebleeds and tinnitus.    Eyes: Negative.  Negative for blurred vision, double vision, photophobia, pain, discharge and redness.   Respiratory: Positive for cough. Negative for hemoptysis, sputum production, shortness of breath, wheezing and stridor.    Cardiovascular: Negative.  Negative for chest pain, palpitations, orthopnea, claudication, leg swelling and PND.   Gastrointestinal: Positive for nausea. Negative for abdominal pain, blood in stool, constipation, diarrhea, heartburn, melena and vomiting.   Genitourinary: Negative.  Negative for dysuria, flank pain, frequency, hematuria and urgency.   Musculoskeletal: Positive for joint pain and myalgias. Negative for back pain, falls and neck pain.   Skin: Negative.  Negative for itching and rash.   Neurological: Negative.  Negative for dizziness, tingling, tremors, sensory change, speech change, focal weakness, seizures, loss of consciousness, weakness and headaches.   Endo/Heme/Allergies: Negative.  Negative for environmental allergies and polydipsia. Does not bruise/bleed easily.   Psychiatric/Behavioral: Negative.  Negative for depression, hallucinations, memory loss, substance abuse and suicidal ideas. The patient is not nervous/anxious and does not have insomnia.        OBJECTIVE  BP 132/72   Pulse 86   Temp 97.8 ??F (36.6 ??C) (Temporal)   Ht 6' (1.829 m)   Wt 269 lb  (122 kg)   SpO2 98%   BMI 36.48 kg/m2    Problem List:  Jay Maxwell  does not have any pertinent problems on file.    PHYS EX:  Physical Exam   Constitutional: He is oriented to person, place, and time. He appears well-developed and well-nourished. No distress.   HENT:   Head: Normocephalic and atraumatic.   Right Ear: External ear normal.   Left Ear: External ear normal.   Nose: Nose normal.   Mouth/Throat: Oropharynx is clear and moist. No oropharyngeal exudate.   Pt has nasal congestion. Pharynx- erythema.   Eyes: Conjunctivae and EOM are normal. Pupils are equal, round, and reactive to light. Right eye exhibits no discharge. Left eye exhibits no discharge. No scleral icterus.   Neck: Normal range of motion. Neck supple. No JVD present. No tracheal deviation present. No thyromegaly present.   Cardiovascular: Normal rate, regular rhythm, normal heart sounds and intact distal pulses.  Exam reveals no gallop and no friction rub.    No murmur heard.  Pulmonary/Chest: Effort normal and breath sounds normal. No stridor. No respiratory distress. He has no wheezes. He has no rales. He exhibits no tenderness.   Abdominal: Soft. Bowel sounds are normal. He exhibits no distension and no mass. There is no tenderness. There is no rebound and no guarding. No hernia.   Musculoskeletal: He exhibits tenderness. He exhibits no edema or deformity.   Pain and decreased ROM shoulder. Pt has RLS .   Lymphadenopathy:     He has no cervical adenopathy.  Neurological: He is alert and oriented to person, place, and time. He has normal reflexes. He displays normal reflexes. No cranial nerve deficit. He exhibits normal muscle tone. Coordination normal.   Skin: Skin is warm. No rash noted. He is not diaphoretic. No erythema. No pallor.   Psychiatric: He has a normal mood and affect. His behavior is normal. Judgment and thought content normal.   Nursing note and vitals reviewed.    ASSESSMENT/PLAN  [name] was seen today for medication refill,  cough and health maintenance.    Diagnoses and all orders for this visit:    Acute bacterial sinusitis  -     amoxicillin (AMOXIL) 250 MG capsule; Take 1 capsule by mouth 3 times daily for 10 days  -     guaiFENesin (MUCINEX) 600 MG extended release tablet; Take 1 tablet by mouth 2 times daily  Not controlled. Amoxil, mucinex.  Restless leg  -     gabapentin (NEURONTIN) 300 MG capsule; Take 1 capsule by mouth nightly  Controlled. neurontin.  Shoulder impingement, right  -     ibuprofen (ADVIL;MOTRIN) 800 MG tablet; Take 1 tablet by mouth every 8 hours as needed for Pain  Not controlled. Ibuprofen.  Cough  -     guaiFENesin (MUCINEX) 600 MG extended release tablet; Take 1 tablet by mouth 2 times daily  Not controlled. Cough.  Nasal congestion  Not controlled. mucinex.      Outpatient Encounter Prescriptions as of 07/20/2016   Medication Sig Dispense Refill   ??? gabapentin (NEURONTIN) 300 MG capsule Take 1 capsule by mouth nightly 90 capsule 1   ??? ibuprofen (ADVIL;MOTRIN) 800 MG tablet Take 1 tablet by mouth every 8 hours as needed for Pain 120 tablet 2   ??? amoxicillin (AMOXIL) 250 MG capsule Take 1 capsule by mouth 3 times daily for 10 days 30 capsule 0   ??? guaiFENesin (MUCINEX) 600 MG extended release tablet Take 1 tablet by mouth 2 times daily 30 tablet 0   ??? pramipexole (MIRAPEX) 0.5 MG tablet TAKE 1 TABLET BY MOUTH NIGHTLY 90 tablet 1   ??? [DISCONTINUED] gabapentin (NEURONTIN) 300 MG capsule Take 1 capsule by mouth nightly 30 capsule 3   ??? [DISCONTINUED] ibuprofen (ADVIL;MOTRIN) 800 MG tablet Take 1 tablet by mouth every 8 hours as needed for Pain 90 tablet 3     No facility-administered encounter medications on file as of 07/20/2016.        Return in about 6 months (around 01/17/2017) for recheck fasting lab in 1 month.            Reviewed recent labs related to [facility] current problems      Discussed importance of regular Health Maintenance follow up  Health Maintenance   Topic   ??? HIV screen    ??? Lipid screen     ??? Diabetes screen    ??? Flu vaccine (1)   ??? DTaP/Tdap/Td vaccine (2 - Td)

## 2016-11-06 ENCOUNTER — Encounter: Payer: BLUE CROSS/BLUE SHIELD | Attending: Family Medicine | Primary: Family Medicine

## 2016-11-06 ENCOUNTER — Encounter

## 2016-11-06 MED ORDER — PRAMIPEXOLE DIHYDROCHLORIDE 0.5 MG PO TABS
0.5 MG | ORAL_TABLET | ORAL | 0 refills | Status: DC
Start: 2016-11-06 — End: 2016-12-28

## 2016-11-13 ENCOUNTER — Ambulatory Visit
Admit: 2016-11-13 | Discharge: 2016-11-13 | Payer: BLUE CROSS/BLUE SHIELD | Attending: Family Medicine | Primary: Family Medicine

## 2016-11-13 DIAGNOSIS — G2581 Restless legs syndrome: Secondary | ICD-10-CM

## 2016-11-13 MED ORDER — IBUPROFEN 800 MG PO TABS
800 MG | ORAL_TABLET | Freq: Three times a day (TID) | ORAL | 5 refills | Status: DC | PRN
Start: 2016-11-13 — End: 2018-01-10

## 2016-11-13 MED ORDER — GABAPENTIN 300 MG PO CAPS
300 MG | ORAL_CAPSULE | Freq: Every evening | ORAL | 1 refills | Status: DC
Start: 2016-11-13 — End: 2017-03-19

## 2016-11-14 ENCOUNTER — Inpatient Hospital Stay: Attending: Family Medicine | Primary: Family Medicine

## 2016-11-14 LAB — PSA SCREENING: PSA: 0.53 ng/mL (ref 0.00–4.00)

## 2016-11-14 LAB — CBC WITH AUTO DIFFERENTIAL
Basophils %: 0.8 % (ref 0.0–2.0)
Basophils Absolute: 0.07 E9/L (ref 0.00–0.20)
Eosinophils %: 1.5 % (ref 0.0–6.0)
Eosinophils Absolute: 0.13 E9/L (ref 0.05–0.50)
Hematocrit: 47.3 % (ref 37.0–54.0)
Hemoglobin: 15 g/dL (ref 12.5–16.5)
Immature Granulocytes #: 0.04 E9/L
Immature Granulocytes %: 0.5 % (ref 0.0–5.0)
Lymphocytes %: 22.1 % (ref 20.0–42.0)
Lymphocytes Absolute: 1.94 E9/L (ref 1.50–4.00)
MCH: 27.2 pg (ref 26.0–35.0)
MCHC: 31.7 % — ABNORMAL LOW (ref 32.0–34.5)
MCV: 85.8 fL (ref 80.0–99.9)
MPV: 10.4 fL (ref 7.0–12.0)
Monocytes %: 7.4 % (ref 2.0–12.0)
Monocytes Absolute: 0.65 E9/L (ref 0.10–0.95)
Neutrophils %: 67.7 % (ref 43.0–80.0)
Neutrophils Absolute: 5.96 E9/L (ref 1.80–7.30)
Platelets: 371 E9/L (ref 130–450)
RBC: 5.51 E12/L (ref 3.80–5.80)
RDW: 13.5 fL (ref 11.5–15.0)
WBC: 8.8 E9/L (ref 4.5–11.5)

## 2016-11-14 LAB — COMPREHENSIVE METABOLIC PANEL
ALT: 32 U/L (ref 0–40)
AST: 20 U/L (ref 0–39)
Albumin: 4 g/dL (ref 3.5–5.2)
Alkaline Phosphatase: 82 U/L (ref 40–129)
Anion Gap: 16 mmol/L (ref 7–16)
BUN: 12 mg/dL (ref 6–20)
CO2: 23 mmol/L (ref 22–29)
Calcium: 9.2 mg/dL (ref 8.6–10.2)
Chloride: 102 mmol/L (ref 98–107)
Creatinine: 0.8 mg/dL (ref 0.7–1.2)
GFR African American: >60
GFR Non-African American: >60 mL/min/{1.73_m2} (ref >=60)
Glucose: 131 mg/dL — ABNORMAL HIGH (ref 74–109)
Potassium: 4.4 mmol/L (ref 3.5–5.0)
Sodium: 141 mmol/L (ref 132–146)
Total Bilirubin: 0.3 mg/dL (ref 0.0–1.2)
Total Protein: 7.1 g/dL (ref 6.4–8.3)

## 2016-11-14 LAB — LIPID PANEL
Cholesterol, Total: 187 mg/dL (ref 0–199)
HDL: 33 mg/dL (ref >40)
LDL Calculated: 111 mg/dL — ABNORMAL HIGH (ref 0–99)
Triglycerides: 215 mg/dL — ABNORMAL HIGH (ref 0–149)
VLDL Cholesterol Calculated: 43 mg/dL

## 2016-11-14 LAB — HEMOGLOBIN A1C: Hemoglobin A1C: 6.2 % — ABNORMAL HIGH (ref 4.8–5.9)

## 2016-11-14 LAB — URINE DRUG SCREEN
Amphetamine Screen, Urine: NOT DETECTED (ref <1000)
Barbiturate Screen, Ur: NOT DETECTED (ref <200)
Benzodiazepine Screen, Urine: NOT DETECTED (ref <200)
Cannabinoid Scrn, Ur: NOT DETECTED
Cocaine Metabolite Screen, Urine: NOT DETECTED (ref <300)
Methadone Screen, Urine: NOT DETECTED (ref <300)
Opiate Scrn, Ur: NOT DETECTED
PCP Screen, Urine: NOT DETECTED (ref <25)
Propoxyphene Scrn, Ur: NOT DETECTED (ref <300)

## 2016-11-14 LAB — TSH: TSH: 1.78 u[IU]/mL (ref 0.270–4.200)

## 2016-11-17 NOTE — Progress Notes (Signed)
SUBJECTIVE  Jay Maxwell is a 46 y.o. male.    HPI/Chief C/O:  Chief Complaint   Patient presents with   ??? Medication Refill   ??? Discuss Medications     mirapex 0.5mg  does not work for his restless leg symptoms   ??? Health Maintenance     Reviewed; out of flu     No Known Allergies  This 46 year old male presents with RLS, left shoulder impingement followed by WC, and fatigue.  ROS:  Review of Systems   Constitutional: Positive for malaise/fatigue. Negative for chills, diaphoresis, fever and weight loss.   HENT: Negative.  Negative for congestion, ear discharge, ear pain, hearing loss, nosebleeds, sinus pain, sore throat and tinnitus.    Eyes: Negative.  Negative for blurred vision, double vision, photophobia, pain, discharge and redness.   Respiratory: Negative.  Negative for cough, hemoptysis, sputum production, shortness of breath, wheezing and stridor.    Cardiovascular: Negative.  Negative for chest pain, palpitations, orthopnea, claudication, leg swelling and PND.   Gastrointestinal: Negative.  Negative for abdominal pain, blood in stool, constipation, diarrhea, heartburn, melena, nausea and vomiting.   Genitourinary: Negative.  Negative for dysuria, flank pain, frequency, hematuria and urgency.   Musculoskeletal: Positive for joint pain and myalgias. Negative for back pain, falls and neck pain.   Skin: Negative.  Negative for itching and rash.   Neurological: Positive for tingling. Negative for dizziness, tremors, sensory change, speech change, focal weakness, seizures, loss of consciousness, weakness and headaches.   Endo/Heme/Allergies: Negative.  Negative for environmental allergies and polydipsia. Does not bruise/bleed easily.   Psychiatric/Behavioral: Negative for depression, hallucinations, memory loss, substance abuse and suicidal ideas. The patient is nervous/anxious and has insomnia.          Past Medical/Surgical Hx;  Reviewed with patient      Diagnosis Date   ??? 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 Diverticulitis    ??? Hernia    ??? Mixed hyperlipidemia 11/17/2016   ??? Restless leg      Past Surgical History:   Procedure Laterality Date   ??? ABDOMEN SURGERY      Bowel removal   ??? APPENDECTOMY     ??? HERNIA REPAIR  2008   ??? OTHER SURGICAL HISTORY  09/19/2014    laparoscopic incisional hernia repair with mesh   ??? TONSILLECTOMY         Past Family Hx:  Reviewed with patient      Problem Relation Age of Onset   ??? Heart Disease Mother    ??? High Blood Pressure Mother    ??? Diabetes Mother    ??? High Blood Pressure Father    ??? Diabetes Father    ??? Heart Disease Father        Social Hx:  Reviewed with patient  Social History   Substance Use Topics   ??? Smoking status: Never Smoker   ??? Smokeless tobacco: Never Used   ??? Alcohol use No      Comment: social       OBJECTIVE  BP 130/72    Pulse 82    Ht 6\' 1"  (1.854 m)    Wt 296 lb (134.3 kg)    SpO2 97%    BMI 39.05 kg/m??     Problem List:  [name]  does not have any pertinent problems on file.    PHYS EX:  Physical Exam   Constitutional: He is oriented to person, place, and time.  He appears well-developed and well-nourished. No distress.   HENT:   Head: Normocephalic and atraumatic.   Right Ear: External ear normal.   Left Ear: External ear normal.   Nose: Nose normal.   Mouth/Throat: Oropharynx is clear and moist. No oropharyngeal exudate.   Eyes: Conjunctivae and EOM are normal. Pupils are equal, round, and reactive to light. Right eye exhibits no discharge. Left eye exhibits no discharge. No scleral icterus.   Neck: Normal range of motion. Neck supple. No JVD present. No tracheal deviation present. No thyromegaly present.   Cardiovascular: Normal rate, regular rhythm, normal heart sounds and intact distal pulses.  Exam reveals no gallop and no friction rub.    No murmur heard.  Pulmonary/Chest: Effort normal and breath sounds normal. No stridor. No respiratory distress. He has no wheezes. He has no rales. He exhibits no tenderness.    Abdominal: Soft. Bowel sounds are normal. He exhibits no distension and no mass. There is no tenderness. There is no rebound and no guarding. No hernia.   Musculoskeletal: He exhibits tenderness. He exhibits no edema or deformity.   Pain and decreased ROM left shoulder with tingling and numbness.   Lymphadenopathy:     He has no cervical adenopathy.   Neurological: He is alert and oriented to person, place, and time. He has normal reflexes. He displays normal reflexes. No cranial nerve deficit or sensory deficit. He exhibits normal muscle tone. Coordination normal.   Skin: Skin is warm. No rash noted. He is not diaphoretic. No erythema. No pallor.   Psychiatric: He has a normal mood and affect. His behavior is normal. Judgment and thought content normal.   Nursing note and vitals reviewed.    ASSESSMENT/PLAN  [name] was seen today for medication refill, discuss medications and health maintenance.    Diagnoses and all orders for this visit:    Restless leg  -     gabapentin (NEURONTIN) 300 MG capsule; Take 1 capsule by mouth nightly for 180 days.  -     CBC Auto Differential; Future  -     Comprehensive Metabolic Panel; Future  -     Psa screening; Future  Not controlled. Lab, neurontin, urine screen, increase mirapex 0.5 from 1 to tablets at night.  Impingement syndrome of left shoulder  -     gabapentin (NEURONTIN) 300 MG capsule; Take 1 capsule by mouth nightly for 180 days.  -     ibuprofen (ADVIL;MOTRIN) 800 MG tablet; Take 1 tablet by mouth every 8 hours as needed for Pain  -     CBC Auto Differential; Future  -     Comprehensive Metabolic Panel; Future  -     URINE DRUG SCREEN; Future  -     Psa screening; Future  Not controlled. Lab, neurontin, ibuprofen, WC doctor.  Mixed hyperlipidemia  -     CBC Auto Differential; Future  -     Comprehensive Metabolic Panel; Future  -     Lipid Panel; Future  -     Psa screening; Future  Not controlled. Lab, low chol. Diet.  IFG (impaired fasting glucose)  -     CBC  Auto Differential; Future  -     Comprehensive Metabolic Panel; Future  -     Hemoglobin A1C; Future  -     Psa screening; Future  Instructed on lab.  Fatigue, unspecified type  -     CBC Auto Differential; Future  -  Comprehensive Metabolic Panel; Future  -     TSH without Reflex; Future  -     Psa screening; Future  Not controlled. Lab.  Screening for prostate cancer  -     Psa screening; Future  Instructed on PSA.      Outpatient Encounter Prescriptions as of 11/13/2016   Medication Sig Dispense Refill   ??? gabapentin (NEURONTIN) 300 MG capsule Take 1 capsule by mouth nightly for 180 days. 90 capsule 1   ??? ibuprofen (ADVIL;MOTRIN) 800 MG tablet Take 1 tablet by mouth every 8 hours as needed for Pain 120 tablet 5   ??? pramipexole (MIRAPEX) 0.5 MG tablet TAKE 1 TABLET BY MOUTH NIGHTLY (Patient taking differently: TAKE 2 TABLET BY MOUTH NIGHTLY) 90 tablet 0   ??? [DISCONTINUED] gabapentin (NEURONTIN) 300 MG capsule Take 1 capsule by mouth nightly 90 capsule 1   ??? [DISCONTINUED] ibuprofen (ADVIL;MOTRIN) 800 MG tablet Take 1 tablet by mouth every 8 hours as needed for Pain 120 tablet 2   ??? [DISCONTINUED] guaiFENesin (MUCINEX) 600 MG extended release tablet Take 1 tablet by mouth 2 times daily 30 tablet 0     No facility-administered encounter medications on file as of 11/13/2016.        Return in about 3 months (around 02/11/2017).        Reviewed recent labs related to [facility] current problems      Discussed importance of regular Health Maintenance follow up  Health Maintenance   Topic   ??? HIV screen    ??? Flu vaccine (1)   ??? A1C test (Diabetic or Prediabetic)    ??? Lipid screen    ??? DTaP/Tdap/Td vaccine (2 - Td)

## 2016-12-28 ENCOUNTER — Telehealth

## 2016-12-28 MED ORDER — PRAMIPEXOLE DIHYDROCHLORIDE 0.5 MG PO TABS
0.5 MG | ORAL_TABLET | ORAL | 0 refills | Status: DC
Start: 2016-12-28 — End: 2017-02-04

## 2016-12-28 NOTE — Telephone Encounter (Signed)
Patient's wife called needs refill for mirapex 0.5mg  [facility]

## 2016-12-28 NOTE — Telephone Encounter (Signed)
Medication sent to patient's pharmacy.

## 2017-02-04 ENCOUNTER — Encounter

## 2017-02-04 MED ORDER — PRAMIPEXOLE DIHYDROCHLORIDE 0.5 MG PO TABS
0.5 MG | ORAL_TABLET | ORAL | 0 refills | Status: DC
Start: 2017-02-04 — End: 2017-03-19

## 2017-03-19 ENCOUNTER — Ambulatory Visit
Admit: 2017-03-19 | Discharge: 2017-03-19 | Payer: BLUE CROSS/BLUE SHIELD | Attending: Family Medicine | Primary: Family Medicine

## 2017-03-19 DIAGNOSIS — I10 Essential (primary) hypertension: Secondary | ICD-10-CM

## 2017-03-19 MED ORDER — VARDENAFIL HCL 20 MG PO TABS
20 | ORAL_TABLET | ORAL | 3 refills | Status: DC | PRN
Start: 2017-03-19 — End: 2017-04-20

## 2017-03-19 MED ORDER — PRAMIPEXOLE DIHYDROCHLORIDE 0.5 MG PO TABS
0.5 MG | ORAL_TABLET | Freq: Two times a day (BID) | ORAL | 1 refills | Status: DC
Start: 2017-03-19 — End: 2017-09-16

## 2017-03-19 NOTE — Progress Notes (Signed)
SUBJECTIVE  Jay Maxwell is a 46 y.o. male.    HPI/Chief C/O:  Chief Complaint   Patient presents with   . Leg Pain     Pt here to discuss restless leg issues     No Known Allergies  He is here saying that his RLS is worse      Hypertension   This is a chronic problem. The current episode started more than 1 year ago. The problem is controlled. Associated symptoms include anxiety. Pertinent negatives include no blurred vision, chest pain, headaches, malaise/fatigue, neck pain, orthopnea, palpitations, peripheral edema, PND, shortness of breath or sweats. Risk factors for coronary artery disease include male gender and stress. Past treatments include lifestyle changes. The current treatment provides significant improvement. Compliance problems include exercise, diet and psychosocial issues.  There is no history of angina, kidney disease, CAD/MI, CVA, heart failure, left ventricular hypertrophy, PVD or retinopathy. There is no history of chronic renal disease, coarctation of the aorta, hyperaldosteronism, hypercortisolism, hyperparathyroidism, a hypertension causing med, pheochromocytoma, renovascular disease, sleep apnea or a thyroid problem.       ROS:  Review of Systems   Constitutional: Negative.  Negative for chills, diaphoresis, fever, malaise/fatigue and weight loss.   HENT: Negative.  Negative for congestion, ear discharge, ear pain, hearing loss, nosebleeds, sinus pain, sore throat and tinnitus.    Eyes: Negative.  Negative for blurred vision, double vision, photophobia, pain, discharge and redness.   Respiratory: Negative.  Negative for cough, hemoptysis, sputum production, shortness of breath, wheezing and stridor.    Cardiovascular: Negative.  Negative for chest pain, palpitations, orthopnea, claudication, leg swelling and PND.   Gastrointestinal: Negative.  Negative for abdominal pain, blood in stool, constipation, diarrhea, heartburn, melena, nausea and vomiting.   Genitourinary: Negative.  Negative  for dysuria, flank pain, frequency, hematuria and urgency.   Musculoskeletal: Positive for myalgias (RLS pain at night). Negative for back pain, falls, joint pain and neck pain.   Skin: Negative.  Negative for itching and rash.   Neurological: Negative.  Negative for dizziness, tremors, sensory change, speech change, focal weakness, seizures, loss of consciousness, weakness and headaches.   Endo/Heme/Allergies: Negative.  Negative for environmental allergies and polydipsia. Does not bruise/bleed easily.   Psychiatric/Behavioral: Negative.      Past Medical/Surgical Hx;  Reviewed with patient      Diagnosis Date   . 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 Diverticulitis    . Hernia    . Mixed hyperlipidemia 11/17/2016   . Restless leg      Past Surgical History:   Procedure Laterality Date   . ABDOMEN SURGERY      Bowel removal   . APPENDECTOMY     . HERNIA REPAIR  2008   . OTHER SURGICAL HISTORY  09/19/2014    laparoscopic incisional hernia repair with mesh   . TONSILLECTOMY         Past Family Hx:  Reviewed with patient      Problem Relation Age of Onset   . Heart Disease Mother    . High Blood Pressure Mother    . Diabetes Mother    . High Blood Pressure Father    . Diabetes Father    . Heart Disease Father        Social Hx:  Reviewed with patient  Social History   Substance Use Topics   . Smoking status: Never Smoker   . Smokeless tobacco: Never Used   . Alcohol  use No      Comment: social       OBJECTIVE  BP 122/84   Pulse 81   Resp 18   Ht 6' (1.829 m)   Wt 285 lb (129.3 kg)   SpO2 97%   BMI 38.65 kg/m     Problem List:  [name]  does not have any pertinent problems on file.    PHYS EX:  Physical Exam   Constitutional: He is oriented to person, place, and time. He appears well-developed and well-nourished. No distress.   HENT:   Head: Normocephalic and atraumatic.   Right Ear: External ear normal.   Left Ear: External ear normal.   Nose: Nose normal.   Mouth/Throat: Oropharynx is clear  and moist. No oropharyngeal exudate.   Eyes: Conjunctivae and EOM are normal. Pupils are equal, round, and reactive to light. Right eye exhibits no discharge. Left eye exhibits no discharge. No scleral icterus.   Neck: Normal range of motion. Neck supple. No JVD present. No tracheal deviation present. No thyromegaly present.   Cardiovascular: Normal rate, regular rhythm and normal heart sounds.  Exam reveals no gallop and no friction rub.    No murmur heard.  Pulmonary/Chest: Effort normal and breath sounds normal. No stridor. No respiratory distress. He has no wheezes. He has no rales. He exhibits no tenderness.   Abdominal: Soft. Bowel sounds are normal. He exhibits no distension and no mass. There is no tenderness. There is no rebound and no guarding. No hernia.   Genitourinary: Rectum normal, prostate normal and penis normal. Rectal exam shows guaiac negative stool. No penile tenderness.   Genitourinary Comments: Teste normal   Musculoskeletal: Normal range of motion. He exhibits no edema, tenderness or deformity.   Lymphadenopathy:     He has no cervical adenopathy.   Neurological: He is alert and oriented to person, place, and time. He has normal reflexes. He displays normal reflexes. No cranial nerve deficit or sensory deficit. He exhibits normal muscle tone. Coordination normal.   Skin: Skin is warm. No rash noted. He is not diaphoretic. No erythema. No pallor.   Nursing note and vitals reviewed.      ASSESSMENT/PLAN  Kesler was seen today for leg pain.    Diagnoses and all orders for this visit:    Essential hypertension  -     Basic Metabolic Panel; Future  -     CBC Auto Differential; Future    Restless leg  -     pramipexole (MIRAPEX) 0.5 MG tablet; Take 1 tablet by mouth 2 times daily  -     Basic Metabolic Panel; Future  -     CBC Auto Differential; Future  -- I will increase Mirapex . 5 to BID     IFG (impaired fasting glucose)  -     Basic Metabolic Panel; Future  -     CBC Auto Differential;  Future  -     Hemoglobin A1C; Future  -     Microalbumin, Ur; Future    Mixed hyperlipidemia  -     Basic Metabolic Panel; Future  -     Lipid Panel; Future  -     CBC Auto Differential; Future    Erectile dysfunction, unspecified erectile dysfunction type  -     vardenafil (LEVITRA) 20 MG tablet; Take 1 tablet by mouth as needed for Erectile Dysfunction    He refused cardiology visit , stress test,---AMA    Outpatient Encounter Prescriptions as  of 03/19/2017   Medication Sig Dispense Refill   . pramipexole (MIRAPEX) 0.5 MG tablet Take 1 tablet by mouth 2 times daily 180 tablet 1   . vardenafil (LEVITRA) 20 MG tablet Take 1 tablet by mouth as needed for Erectile Dysfunction 3 tablet 3   . ibuprofen (ADVIL;MOTRIN) 800 MG tablet Take 1 tablet by mouth every 8 hours as needed for Pain 120 tablet 5   . [DISCONTINUED] pramipexole (MIRAPEX) 0.5 MG tablet TAKE 1 TABLET BY MOUTH NIGHTLY 90 tablet 0   . [DISCONTINUED] gabapentin (NEURONTIN) 300 MG capsule Take 1 capsule by mouth nightly for 180 days. 90 capsule 1     No facility-administered encounter medications on file as of 03/19/2017.        Return in about 3 months (around 06/19/2017).        [facility]      Discussed importance of regular Health Maintenance follow up  Health Maintenance   Topic   . HIV screen    . Flu vaccine (Season Ended)   . A1C test (Diabetic or Prediabetic)    . Lipid screen    . DTaP/Tdap/Td vaccine (2 - Td)

## 2017-03-19 NOTE — Patient Instructions (Addendum)
Patient Education        Learning About Diabetes Food Guidelines  Your Care Instructions    Meal planning is important to manage diabetes. It helps keep your blood sugar at a target level (which you set with your doctor). You don't have to eat special foods. You can eat what your family eats, including sweets once in a while. But you do have to pay attention to how often you eat and how much you eat of certain foods.  You may want to work with a dietitian or a certified diabetes educator (CDE) to help you plan meals and snacks. A dietitian or CDE can also help you lose weight if that is one of your goals.  What should you know about eating carbs?  Managing the amount of carbohydrate (carbs) you eat is an important part of healthy meals when you have diabetes. Carbohydrate is found in many foods.   Learn which foods have carbs. And learn the amounts of carbs in different foods.   Bread, cereal, pasta, and rice have about 15 grams of carbs in a serving. A serving is 1 slice of bread (1 ounce),  cup of cooked cereal, or 1/3 cup of cooked pasta or rice.   Fruits have 15 grams of carbs in a serving. A serving is 1 small fresh fruit, such as an apple or orange;  of a banana;  cup of cooked or canned fruit;  cup of fruit juice; 1 cup of melon or raspberries; or 2 tablespoons of dried fruit.   Milk and no-sugar-added yogurt have 15 grams of carbs in a serving. A serving is 1 cup of milk or 2/3 cup of no-sugar-added yogurt.   Starchy vegetables have 15 grams of carbs in a serving. A serving is  cup of mashed potatoes or sweet potato; 1 cup winter squash;  of a small baked potato;  cup of cooked beans; or  cup cooked corn or green peas.   Learn how much carbs to eat each day and at each meal. A dietitian or CDE can teach you how to keep track of the amount of carbs you eat. This is called carbohydrate counting.   If you are not sure how to count carbohydrate grams, use the Plate Method to plan meals. It is a  good, quick way to make sure that you have a balanced meal. It also helps you spread carbs throughout the day.   Divide your plate by types of foods. Put non-starchy vegetables on half the plate, meat or other protein food on one-quarter of the plate, and a grain or starchy vegetable in the final quarter of the plate. To this you can add a small piece of fruit and 1 cup of milk or yogurt, depending on how many carbs you are supposed to eat at a meal.   Try to eat about the same amount of carbs at each meal. Do not "save up" your daily allowance of carbs to eat at one meal.   Proteins have very little or no carbs per serving. Examples of proteins are beef, chicken, turkey, fish, eggs, tofu, cheese, cottage cheese, and peanut butter. A serving size of meat is 3 ounces, which is about the size of a deck of cards. Examples of meat substitute serving sizes (equal to 1 ounce of meat) are 1/4 cup of cottage cheese, 1 egg, 1 tablespoon of peanut butter, and  cup of tofu.  How can you eat out and still eat healthy?     Learn to estimate the serving sizes of foods that have carbohydrate. If you measure food at home, it will be easier to estimate the amount in a serving of restaurant food.   If the meal you order has too much carbohydrate (such as potatoes, corn, or baked beans), ask to have a low-carbohydrate food instead. Ask for a salad or green vegetables.   If you use insulin, check your blood sugar before and after eating out to help you plan how much to eat in the future.   If you eat more carbohydrate at a meal than you had planned, take a walk or do other exercise. This will help lower your blood sugar.  What else should you know?   Limit saturated fat, such as the fat from meat and dairy products. This is a healthy choice because people who have diabetes are at higher risk of heart disease. So choose lean cuts of meat and nonfat or low-fat dairy products. Use olive or canola oil instead of butter or shortening  when cooking.   Don't skip meals. Your blood sugar may drop too low if you skip meals and take insulin or certain medicines for diabetes.   Check with your doctor before you drink alcohol. Alcohol can cause your blood sugar to drop too low. Alcohol can also cause a bad reaction if you take certain diabetes medicines.  Follow-up care is a key part of your treatment and safety. Be sure to make and go to all appointments, and call your doctor if you are having problems. It's also a good idea to know your test results and keep a list of the medicines you take.  Where can you learn more?  Go to [email or web address] and sign in to your MyChart account. Enter I147 in the Search Health Information box to learn more about "Learning About Diabetes Food Guidelines."     If you do not have an account, please click on the "Sign Up Now" link.  Current as of: October 01, 2016  Content Version: 11.6   2006-2018 [employer]. Care instructions adapted under license by [facility]. If you have questions about a medical condition or this instruction, always ask your healthcare professional. [employer] disclaims any warranty or liability for your use of this information.       Patient Education        Learning About Meal Planning for Diabetes  Why plan your meals?  Meal planning can be a key part of managing diabetes. Planning meals and snacks with the right balance of carbohydrate, protein, and fat can help you keep your blood sugar at the target level you set with your doctor.  You don't have to eat special foods. You can eat what your family eats, including sweets once in a while. But you do have to pay attention to how often you eat and how much you eat of certain foods.  You may want to work with a dietitian or a certified diabetes educator. He or she can give you tips and meal ideas and can answer your questions about meal planning. This health professional can also help you reach  a healthy weight if that is one of your goals.  What plan is right for you?  Your dietitian or diabetes educator may suggest that you start with the plate format or carbohydrate counting.  The plate format  The plate format is a simple way to help you manage how you eat. You plan meals by learning how much   space each food should take on a plate. Using the plate format helps you spread carbohydrate throughout the day. It can make it easier to keep your blood sugar level within your target range. It also helps you see if you're eating healthy portion sizes.  To use the plate format, you put non-starchy vegetables on half your plate. Add meat or meat substitutes on one-quarter of the plate. Put a grain or starchy vegetable (such as brown rice or a potato) on the final quarter of the plate. You can add a small piece of fruit and some low-fat or fat-free milk or yogurt, depending on your carbohydrate goal for each meal.  Here are some tips for using the plate format:   Make sure that you are not using an oversized plate. A 9-inch plate is best. Many restaurants use larger plates.   Get used to using the plate format at home. Then you can use it when you eat out.   Write down your questions about using the plate format. Talk to your doctor, a dietitian, or a diabetes educator about your concerns.  Carbohydrate counting  With carbohydrate counting, you plan meals based on the amount of carbohydrate in each food. Carbohydrate raises blood sugar higher and more quickly than any other nutrient. It is found in desserts, breads and cereals, and fruit. It's also found in starchy vegetables such as potatoes and corn, grains such as rice and pasta, and milk and yogurt. Spreading carbohydrate throughout the day helps keep your blood sugar levels within your target range.  Your daily amount depends on several things, including your weight, how active you are, which diabetes medicines you take, and what your goals are for your  blood sugar levels. A registered dietitian or diabetes educator can help you plan how much carbohydrate to include in each meal and snack.  A guideline for your daily amount of carbohydrate is:   45 to 60 grams at each meal. That's about the same as 3 to 4 carbohydrate servings.   15 to 20 grams at each snack. That's about the same as 1 carbohydrate serving.  The Nutrition Facts label on packaged foods tells you how much carbohydrate is in a serving of the food. First, look at the serving size on the food label. Is that the amount you eat in a serving? All of the nutrition information on a food label is based on that serving size. So if you eat more or less than that, you'll need to adjust the other numbers. Total carbohydrate is the next thing you need to look for on the label. If you count carbohydrate servings, one serving of carbohydrate is 15 grams.  For foods that don't come with labels, such as fresh fruits and vegetables, you'll need a guide that lists carbohydrate in these foods. Ask your doctor, dietitian, or diabetes educator about books or other nutrition guides you can use.  If you take insulin, you need to know how many grams of carbohydrate are in a meal. This lets you know how much rapid-acting insulin to take before you eat. If you use an insulin pump, you get a constant rate of insulin during the day. So the pump must be programmed at meals to give you extra insulin to cover the rise in blood sugar after meals.  When you know how much carbohydrate you will eat, you can take the right amount of insulin. Or, if you always use the same amount of insulin, you need to make   sure that you eat the same amount of carbohydrate at meals.  If you need more help to understand carbohydrate counting and food labels, ask your doctor, dietitian, or diabetes educator.  How do you get started with meal planning?  Here are some tips to get started:   Plan your meals a week at a time. Don't forget to include snacks  too.   Use cookbooks or online recipes to plan several main meals. Plan some quick meals for busy nights. You also can double some recipes that freeze well. Then you can save half for other busy nights when you don't have time to cook.   Make sure you have the ingredients you need for your recipes. If you're running low on basic items, put these items on your shopping list too.   List foods that you use to make breakfasts, lunches, and snacks. List plenty of fruits and vegetables.   Post this list on the refrigerator. Add to it as you think of more things you need.   Take the list to the store to do your weekly shopping.  Follow-up care is a key part of your treatment and safety. Be sure to make and go to all appointments, and call your doctor if you are having problems. It's also a good idea to know your test results and keep a list of the medicines you take.  Where can you learn more?  Go to [email or web address] and sign in to your MyChart account. Enter X936 in the Search Health Information box to learn more about "Learning About Meal Planning for Diabetes."     If you do not have an account, please click on the "Sign Up Now" link.  Current as of: October 01, 2016  Content Version: 11.6   2006-2018 [employer]. Care instructions adapted under license by [facility]. If you have questions about a medical condition or this instruction, always ask your healthcare professional. [employer] disclaims any warranty or liability for your use of this information.

## 2017-04-20 ENCOUNTER — Telehealth

## 2017-04-20 MED ORDER — VARDENAFIL HCL 20 MG PO TABS
20 | ORAL_TABLET | ORAL | 1 refills | Status: DC | PRN
Start: 2017-04-20 — End: 2017-04-27

## 2017-04-20 NOTE — Telephone Encounter (Signed)
[facility] stating that the Levitra worked well for him and was requesting a refill to [facility] [place].    Electronically signed by Laurell JosephsKelly  Amonie Wisser, MA on 04/20/17 at 1:52 PM

## 2017-04-27 ENCOUNTER — Encounter

## 2017-04-27 MED ORDER — VARDENAFIL HCL 20 MG PO TABS
20 | ORAL_TABLET | ORAL | 1 refills | Status: DC | PRN
Start: 2017-04-27 — End: 2018-04-04

## 2017-07-15 ENCOUNTER — Encounter

## 2017-09-16 ENCOUNTER — Encounter

## 2017-09-20 MED ORDER — PRAMIPEXOLE DIHYDROCHLORIDE 0.5 MG PO TABS
0.5 MG | ORAL_TABLET | ORAL | 1 refills | Status: DC
Start: 2017-09-20 — End: 2018-01-10

## 2017-12-29 ENCOUNTER — Encounter

## 2018-01-04 NOTE — Telephone Encounter (Signed)
I have not seen him for a year  He really needs to see a new PCP or come in here when he can

## 2018-01-04 NOTE — Telephone Encounter (Signed)
Patient contacted office asking for a 30 day refill of Mirapex, [place] and has to get a new PCP.

## 2018-01-04 NOTE — Telephone Encounter (Signed)
LM for patient that he needs to be seen here or find a new pcp for medication refill as he has not been seen here since may 2018.

## 2018-01-10 ENCOUNTER — Inpatient Hospital Stay: Payer: PRIVATE HEALTH INSURANCE | Primary: Family Medicine

## 2018-01-10 ENCOUNTER — Ambulatory Visit
Admit: 2018-01-10 | Discharge: 2018-01-10 | Payer: PRIVATE HEALTH INSURANCE | Attending: Family Medicine | Primary: Family Medicine

## 2018-01-10 DIAGNOSIS — Z125 Encounter for screening for malignant neoplasm of prostate: Secondary | ICD-10-CM

## 2018-01-10 DIAGNOSIS — G2581 Restless legs syndrome: Secondary | ICD-10-CM

## 2018-01-10 LAB — LIPID PANEL
Cholesterol, Total: 187 mg/dL (ref 0–199)
HDL: 43 mg/dL (ref >40)
LDL Calculated: 129 mg/dL — ABNORMAL HIGH (ref 0–99)
Triglycerides: 75 mg/dL (ref 0–149)
VLDL Cholesterol Calculated: 15 mg/dL

## 2018-01-10 LAB — CBC WITH AUTO DIFFERENTIAL
Basophils %: 0.4 % (ref 0.0–2.0)
Basophils Absolute: 0.05 E9/L (ref 0.00–0.20)
Eosinophils %: 0.9 % (ref 0.0–6.0)
Eosinophils Absolute: 0.1 E9/L (ref 0.05–0.50)
Hematocrit: 50.3 % (ref 37.0–54.0)
Hemoglobin: 16.1 g/dL (ref 12.5–16.5)
Immature Granulocytes #: 0.05 E9/L
Immature Granulocytes %: 0.4 % (ref 0.0–5.0)
Lymphocytes %: 18.3 % — ABNORMAL LOW (ref 20.0–42.0)
Lymphocytes Absolute: 2.15 E9/L (ref 1.50–4.00)
MCH: 27.9 pg (ref 26.0–35.0)
MCHC: 32 % (ref 32.0–34.5)
MCV: 87 fL (ref 80.0–99.9)
MPV: 10.1 fL (ref 7.0–12.0)
Monocytes %: 8.5 % (ref 2.0–12.0)
Monocytes Absolute: 1 E9/L — ABNORMAL HIGH (ref 0.10–0.95)
Neutrophils %: 71.5 % (ref 43.0–80.0)
Neutrophils Absolute: 8.41 E9/L — ABNORMAL HIGH (ref 1.80–7.30)
Platelets: 347 E9/L (ref 130–450)
RBC: 5.78 E12/L (ref 3.80–5.80)
RDW: 13.7 fL (ref 11.5–15.0)
WBC: 11.8 E9/L — ABNORMAL HIGH (ref 4.5–11.5)

## 2018-01-10 LAB — COMPREHENSIVE METABOLIC PANEL
ALT: 27 U/L (ref 0–40)
AST: 20 U/L (ref 0–39)
Albumin: 4.3 g/dL (ref 3.5–5.2)
Alkaline Phosphatase: 65 U/L (ref 40–129)
Anion Gap: 14 mmol/L (ref 7–16)
BUN: 10 mg/dL (ref 6–20)
CO2: 25 mmol/L (ref 22–29)
Calcium: 9.6 mg/dL (ref 8.6–10.2)
Chloride: 101 mmol/L (ref 98–107)
Creatinine: 0.9 mg/dL (ref 0.7–1.2)
GFR African American: >60
GFR Non-African American: >60 mL/min/{1.73_m2} (ref >=60)
Glucose: 110 mg/dL — ABNORMAL HIGH (ref 74–99)
Potassium: 4.5 mmol/L (ref 3.5–5.0)
Sodium: 140 mmol/L (ref 132–146)
Total Bilirubin: 0.5 mg/dL (ref 0.0–1.2)
Total Protein: 7.4 g/dL (ref 6.4–8.3)

## 2018-01-10 LAB — HEMOGLOBIN A1C: Hemoglobin A1C: 5.5 % (ref 4.0–5.6)

## 2018-01-10 LAB — PSA SCREENING: PSA: 0.72 ng/mL (ref 0.00–4.00)

## 2018-01-10 LAB — TSH: TSH: 1.96 u[IU]/mL (ref 0.270–4.200)

## 2018-01-10 MED ORDER — IBUPROFEN 800 MG PO TABS
800 MG | ORAL_TABLET | Freq: Three times a day (TID) | ORAL | 1 refills | Status: DC | PRN
Start: 2018-01-10 — End: 2019-06-02

## 2018-01-10 MED ORDER — PRAMIPEXOLE DIHYDROCHLORIDE 0.5 MG PO TABS
0.5 MG | ORAL_TABLET | ORAL | 1 refills | Status: DC
Start: 2018-01-10 — End: 2018-07-01

## 2018-01-10 NOTE — Patient Instructions (Addendum)
Patient Education        Restless Legs Syndrome: Care Instructions  Your Care Instructions  Restless legs syndrome is a common nervous system problem. People with this syndrome feel a creeping, achy, or unpleasant feeling in the legs and an overpowering urge to move them. It often occurs in the evening and at night and can lead to sleep problems and tiredness.  Your doctor may suggest doing a study of your sleep patterns to figure out what is happening when you try to sleep. Many people get relief from symptoms when they get regular exercise, eat well, and avoid caffeine, alcohol, and tobacco.  Follow-up care is a key part of your treatment and safety. Be sure to make and go to all appointments, and call your doctor if you are having problems. It's also a good idea to know your test results and keep a list of the medicines you take.  How can you care for yourself at home?  ?? Take your medicines exactly as prescribed. Call your doctor if you think you are having a problem with your medicine.  ?? Try bathing in hot or cold water. Applying a heating pad or ice bag to your legs may also help symptoms.  ?? Stretch and massage your legs before bed or when discomfort begins.  ?? Get some exercise for at least 30 minutes a day on most days of the week. Stop exercising at least 3 hours before bedtime.  ?? Try to plan for situations where you will need to remain seated for long stretches. For example, if you are traveling by car, plan some stops so you can get out and walk around.  ?? Tell your doctor about any medicines you are taking. This includes all over-the-counter, prescription, and herbal medicines. Some medicines, such as antidepressants, antihistamines, and cold and sinus medicines, can make your symptoms worse.  ?? Avoid caffeine products, such as coffee, tea, cola, and chocolate. Caffeine can interrupt your sleep and stimulate you.  ?? Do not smoke. Nicotine can make restless legs worse. If you need help quitting, talk  to your doctor about stop-smoking programs and medicines. These can increase your chances of quitting for good.  ?? Do not drink alcohol late in the evening.  Take steps to help you sleep better  ?? Get plenty of sunlight in the outdoors, particularly later in the afternoon.  ?? Use the evening hours for settling down. Avoid activities that challenge you in the hours before bedtime.  ?? Eat meals at regular times, and do not snack before bedtime.  ?? Keep your bedroom quiet, dark, and cool. Try using a sleep mask and earplugs to help you sleep.  ?? Limit how much you drink at night to reduce your need to get up to urinate. But do not go to bed thirsty.  ?? Run a fan or other steady "white noise" during the night if noises wake you up.  ?? Reserve the bed for sleeping and sex. Do your reading or TV watching in another room.  ?? Once you are in bed, relax from head to toe, and guide your mind to pleasant thoughts.  ?? Do not stay in bed longer than 8 hours, and try to avoid naps.  When should you call for help?  Watch closely for changes in your health, and be sure to contact your doctor if:  ?? ?? You are still not getting enough sleep.   ?? ?? Your symptoms become more severe or happen   more often.   Where can you learn more?  Go to [email or web address] and sign in to your MyChart account. Enter X429 in the Search Health Information box to learn more about "Restless Legs Syndrome: Care Instructions."     If you do not have an account, please click on the "Sign Up Now" link.  Current as of: March 28, 2017  Content Version: 11.9  ?? 2006-2018 [employer]. Care instructions adapted under license by [facility]. If you have questions about a medical condition or this instruction, always ask your healthcare professional. [employer] disclaims any warranty or liability for your use of this information.

## 2018-01-10 NOTE — Progress Notes (Signed)
SUBJECTIVE  Jay Maxwell is a 47 y.o. male.    HPI/Chief C/O:  Chief Complaint   Patient presents with   . Leg Pain     Pt here for refill of meds for RLS - pt would like to discuss increasing dose of Mirapex   . Medication Refill     Pharmacy reviewed - meds pended     No Known Allergies  The patient is here for a medication list and treatment planning review  We will go over our care planning goals as well as take care of all refills  We will set up labs as well   C/O RLS is really bad since he ran out of his medication    Hypertension   This is a chronic problem. The current episode started more than 1 year ago. The problem is controlled. Associated symptoms include anxiety. Pertinent negatives include no blurred vision, chest pain, headaches, malaise/fatigue, neck pain, orthopnea, palpitations, peripheral edema, PND, shortness of breath or sweats. Risk factors for coronary artery disease include male gender and stress. Past treatments include lifestyle changes. The current treatment provides significant improvement. Compliance problems include exercise, diet and psychosocial issues.  There is no history of angina, kidney disease, CAD/MI, CVA, heart failure, left ventricular hypertrophy, PVD or retinopathy. There is no history of chronic renal disease, coarctation of the aorta, hyperaldosteronism, hypercortisolism, hyperparathyroidism, a hypertension causing med, pheochromocytoma, renovascular disease, sleep apnea or a thyroid problem.       ROS:  Review of Systems   Constitutional: Negative.  Negative for activity change, appetite change, chills, diaphoresis, fatigue, fever, malaise/fatigue and unexpected weight change.   HENT: Negative.  Negative for congestion, dental problem, drooling, ear discharge, ear pain, facial swelling, hearing loss, mouth sores, nosebleeds, postnasal drip, rhinorrhea, sinus pressure, sinus pain, sneezing, sore throat, tinnitus, trouble swallowing and voice change.    Eyes: Negative  for blurred vision, photophobia, pain, discharge, redness, itching and visual disturbance.   Respiratory: Negative.  Negative for apnea, cough, choking, chest tightness, shortness of breath, wheezing and stridor.    Cardiovascular: Negative.  Negative for chest pain, palpitations, orthopnea, leg swelling and PND.   Gastrointestinal: Negative.  Negative for abdominal distention, abdominal pain, anal bleeding, blood in stool, constipation, diarrhea and nausea.   Endocrine: Negative.  Negative for cold intolerance, heat intolerance, polydipsia, polyphagia and polyuria.   Genitourinary: Negative.  Negative for decreased urine volume, difficulty urinating, discharge, dysuria, enuresis, flank pain, frequency, genital sores, hematuria, penile pain, penile swelling, scrotal swelling, testicular pain and urgency.   Musculoskeletal: Negative.  Negative for arthralgias, back pain, gait problem, joint swelling, myalgias, neck pain and neck stiffness.   Skin: Negative.  Negative for color change, pallor, rash and wound.   Allergic/Immunologic: Negative.  Negative for environmental allergies, food allergies and immunocompromised state.   Neurological: Negative.  Negative for dizziness, tremors, seizures, syncope, facial asymmetry, speech difficulty, weakness, light-headedness and headaches.   Hematological: Negative.  Negative for adenopathy. Does not bruise/bleed easily.   Psychiatric/Behavioral: Negative.  Negative for agitation, behavioral problems, confusion, decreased concentration, dysphoric mood, hallucinations, self-injury, sleep disturbance and suicidal ideas. The patient is not nervous/anxious and is not hyperactive.         Past Medical/Surgical Hx;  Reviewed with patient      Diagnosis Date   . 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 Diverticulitis    . Hernia    . Mixed hyperlipidemia 11/17/2016   . Restless leg  Past Surgical History:   Procedure Laterality Date   . ABDOMEN SURGERY      Bowel  removal   . APPENDECTOMY     . HERNIA REPAIR  2008   . OTHER SURGICAL HISTORY  09/19/2014    laparoscopic incisional hernia repair with mesh   . TONSILLECTOMY         Past Family Hx:  Reviewed with patient      Problem Relation Age of Onset   . Heart Disease Mother    . High Blood Pressure Mother    . Diabetes Mother    . High Blood Pressure Father    . Diabetes Father    . Heart Disease Father        Social Hx:  Reviewed with patient  Social History     Tobacco Use   . Smoking status: Never Smoker   . Smokeless tobacco: Never Used   Substance Use Topics   . Alcohol use: No     Comment: social       OBJECTIVE  BP 122/82   Pulse 70   Temp 98.2 F (36.8 C) (Temporal)   Resp 18   Ht 6' (1.829 m)   Wt 262 lb (118.8 kg)   SpO2 98%   BMI 35.53 kg/m     Problem List:  Jay Maxwell does not have any pertinent problems on file.    PHYS EX:  Physical Exam   Constitutional: He is oriented to person, place, and time. He appears well-developed and well-nourished. No distress.   HENT:   Head: Normocephalic and atraumatic.   Right Ear: External ear normal.   Left Ear: External ear normal.   Nose: Nose normal.   Mouth/Throat: Oropharynx is clear and moist. No oropharyngeal exudate.   Eyes: Pupils are equal, round, and reactive to light. Conjunctivae and EOM are normal. Right eye exhibits no discharge. Left eye exhibits no discharge. No scleral icterus.   Neck: Normal range of motion. Neck supple. No JVD present. No tracheal deviation present. No thyromegaly present.   Cardiovascular: Normal rate, regular rhythm and normal heart sounds. Exam reveals no gallop and no friction rub.   No murmur heard.  Pulmonary/Chest: Effort normal and breath sounds normal. No stridor. No respiratory distress. He has no wheezes. He has no rales. He exhibits no tenderness.   Abdominal: Soft. Bowel sounds are normal. He exhibits no distension and no mass. There is no tenderness. There is no rebound and no guarding. No hernia.   Genitourinary: Rectum  normal, prostate normal and penis normal.   Musculoskeletal: Normal range of motion. He exhibits no edema, tenderness or deformity.   Lymphadenopathy:     He has no cervical adenopathy.   Neurological: He is alert and oriented to person, place, and time. He has normal reflexes. He displays normal reflexes. No cranial nerve deficit or sensory deficit. He exhibits normal muscle tone. Coordination normal.   Skin: Skin is warm. No rash noted. He is not diaphoretic. No erythema. No pallor.   Nursing note and vitals reviewed.      ASSESSMENT/PLAN  Jay Maxwell was seen today for leg pain and medication refill.    Diagnoses and all orders for this visit:    Restless leg  -     pramipexole (MIRAPEX) 0.5 MG tablet; TAKE 1 TABLET BY MOUTH TWICE A DAY  -     Comprehensive Metabolic Panel; Future  -     CBC Auto Differential; Future  Long talk on treatment  and prevention  Literature is given       Impingement syndrome of left shoulder  -     ibuprofen (ADVIL;MOTRIN) 800 MG tablet; Take 1 tablet by mouth every 8 hours as needed for Pain  -     Comprehensive Metabolic Panel; Future  -     CBC Auto Differential; Future  --PT/ ultra--Rx    TSH (thyroid-stimulating hormone deficiency)  -     Comprehensive Metabolic Panel; Future  -     CBC Auto Differential; Future    Fatigue, unspecified type  -     TSH without Reflex; Future  -     Comprehensive Metabolic Panel; Future  -     CBC Auto Differential; Future    Dyslipidemia  -     Comprehensive Metabolic Panel; Future  -     Lipid Panel; Future  -     CBC Auto Differential; Future    Screening PSA (prostate specific antigen)  -     Psa screening; Future    IFG (impaired fasting glucose)  -     Hemoglobin A1C; Future        Outpatient Encounter Medications as of 01/10/2018   Medication Sig Dispense Refill   . pramipexole (MIRAPEX) 0.5 MG tablet TAKE 1 TABLET BY MOUTH TWICE A DAY 180 tablet 1   . ibuprofen (ADVIL;MOTRIN) 800 MG tablet Take 1 tablet by mouth every 8 hours as needed for Pain  180 tablet 1   . vardenafil (LEVITRA) 20 MG tablet Take 1 tablet by mouth as needed for Erectile Dysfunction 12 tablet 1   . [DISCONTINUED] pramipexole (MIRAPEX) 0.5 MG tablet TAKE 1 TABLET BY MOUTH TWICE A DAY 90 tablet 1   . [DISCONTINUED] ibuprofen (ADVIL;MOTRIN) 800 MG tablet Take 1 tablet by mouth every 8 hours as needed for Pain 120 tablet 5     No facility-administered encounter medications on file as of 01/10/2018.        Return in about 6 months (around 07/13/2018).        Reviewed recent labs related to [facility] current problems      Discussed importance of regular Health Maintenance follow up  Health Maintenance   Topic   . HIV screen    . Flu vaccine (1)   . A1C test (Diabetic or Prediabetic)    . Lipid screen    . DTaP/Tdap/Td vaccine (2 - Td)

## 2018-02-21 ENCOUNTER — Ambulatory Visit (INDEPENDENT_AMBULATORY_CARE_PROVIDER_SITE_OTHER): Payer: 59 | Admitting: Orthopaedic Surgery

## 2018-02-21 ENCOUNTER — Other Ambulatory Visit (INDEPENDENT_AMBULATORY_CARE_PROVIDER_SITE_OTHER): Payer: Self-pay | Admitting: Radiology

## 2018-02-21 ENCOUNTER — Encounter (INDEPENDENT_AMBULATORY_CARE_PROVIDER_SITE_OTHER): Payer: Self-pay | Admitting: Orthopaedic Surgery

## 2018-02-21 ENCOUNTER — Ambulatory Visit (INDEPENDENT_AMBULATORY_CARE_PROVIDER_SITE_OTHER): Payer: 59

## 2018-02-21 VITALS — BP 140/81 | HR 66 | Resp 14 | Ht 71.0 in | Wt 255.0 lb

## 2018-02-21 DIAGNOSIS — M25511 Pain in right shoulder: Secondary | ICD-10-CM

## 2018-02-21 DIAGNOSIS — G8929 Other chronic pain: Secondary | ICD-10-CM

## 2018-02-21 NOTE — Progress Notes (Signed)
Office Visit Note   Patient: Tyler Malone           Date of Birth: [birth year]           MRN: [id number] Visit Date: 02/21/2018              Requested by: Ralene Ok, MD [address] [place], [place] [address] PCP: Ralene Ok, MD   Assessment & Plan: Visit Diagnoses:  1. Chronic right shoulder pain     Plan: Chronic right shoulder pain with probable rotator cuff tear.  Will order MRI scan  Follow-Up Instructions: No follow-ups on file.   Orders:  Orders Placed This Encounter  Procedures  . XR Shoulder Right   No orders of the defined types were placed in this encounter.     Procedures: No procedures performed   Clinical Data: No additional findings.   Subjective: No chief complaint on file. Tyler Malone is a long history of right shoulder problems.  He has had pain for at least 2 years.  He was living in [place] when he experienced the onset of shoulder pain.  He had an MRI scan that might have been consistent with a rotator cuff tear.  He went through a course of physical therapy and it seemed to improve.  Recently he has had more problems with overhead activity and pain when he sleeps on the right side.  No numbness or tingling or neck pain.  He has occult he sleeping.  He has had at least 2 cortisone injections over the last several years with only "temporary relief.  No history of dislocation or subluxation.  HPI  Review of Systems   Objective: Vital Signs: There were no vitals taken for this visit.  Physical Exam  Constitutional: He is oriented to person, place, and time. He appears well-developed and well-nourished.  HENT:  Mouth/Throat: Oropharynx is clear and moist.  Eyes: Pupils are equal, round, and reactive to light. EOM are normal.  Pulmonary/Chest: Effort normal.  Neurological: He is alert and oriented to person, place, and time.  Skin: Skin is warm and dry.  Psychiatric: He has a normal mood and affect. His behavior is normal.    Ortho Exam  awake alert and oriented x3.  Comfortable sitting.  Negative pain with range of motion of cervical spine.  Positive impingement right shoulder.  Minimally positive empty can testing.  Mild pain at the acromioclavicular joint in the anterior subacromial region.  Full overhead motion but with a painful arc.  Biceps intact.  Good strength.  Specialty Comments:  No specialty comments available.  Imaging: Xr Shoulder Right  Result Date: 02/21/2018 Films of the right shoulder were obtained in several projections.  There is no ectopic calcification.  Mild degenerative changes of the acromioclavicular joint.  Humeral head is centered about the glenoid.  Normal space between the humeral head and the acromion.  No obvious Hill-Sachs lesion    PMFS History: There are no active problems to display for this patient.  History reviewed. No pertinent past medical history.  History reviewed. No pertinent family history.  History reviewed. No pertinent surgical history. Social History   Occupational History  . Not on file  Tobacco Use  . Smoking status: Not on file  Substance and Sexual Activity  . Alcohol use: Not on file  . Drug use: Not on file  . Sexual activity: Not on file     Tyler Batman, MD   Note - This record has been created  using Editor, commissioning.  Chart creation errors have been sought, but may not always  have been located. Such creation errors do not reflect on  the standard of medical care.

## 2018-02-22 ENCOUNTER — Encounter (INDEPENDENT_AMBULATORY_CARE_PROVIDER_SITE_OTHER): Payer: Self-pay | Admitting: Orthopaedic Surgery

## 2018-03-07 ENCOUNTER — Other Ambulatory Visit: Payer: Self-pay

## 2018-03-14 ENCOUNTER — Other Ambulatory Visit (INDEPENDENT_AMBULATORY_CARE_PROVIDER_SITE_OTHER): Payer: Self-pay | Admitting: Orthopaedic Surgery

## 2018-03-14 ENCOUNTER — Ambulatory Visit
Admission: RE | Admit: 2018-03-14 | Discharge: 2018-03-14 | Disposition: A | Discharge status: Home / Self Care | Payer: Managed Care, Other (non HMO) | Source: Ambulatory Visit | Attending: Orthopaedic Surgery | Admitting: Orthopaedic Surgery

## 2018-03-14 ENCOUNTER — Other Ambulatory Visit: Payer: Self-pay | Admitting: *Deleted

## 2018-03-14 DIAGNOSIS — S0550XA Penetrating wound with foreign body of unspecified eyeball, initial encounter: Secondary | ICD-10-CM

## 2018-03-14 DIAGNOSIS — M25511 Pain in right shoulder: Secondary | ICD-10-CM

## 2018-03-18 ENCOUNTER — Encounter (INDEPENDENT_AMBULATORY_CARE_PROVIDER_SITE_OTHER): Payer: Self-pay

## 2018-03-18 ENCOUNTER — Ambulatory Visit (INDEPENDENT_AMBULATORY_CARE_PROVIDER_SITE_OTHER): Payer: 59 | Admitting: Orthopaedic Surgery

## 2018-03-18 ENCOUNTER — Encounter (INDEPENDENT_AMBULATORY_CARE_PROVIDER_SITE_OTHER): Payer: Self-pay | Admitting: Orthopaedic Surgery

## 2018-03-18 VITALS — BP 146/93 | HR 66 | Resp 18 | Ht 72.0 in | Wt 251.0 lb

## 2018-03-18 DIAGNOSIS — M25511 Pain in right shoulder: Secondary | ICD-10-CM | POA: Diagnosis not present

## 2018-03-18 DIAGNOSIS — G8929 Other chronic pain: Secondary | ICD-10-CM

## 2018-03-18 MED ORDER — METHYLPREDNISOLONE ACETATE 40 MG/ML IJ SUSP
80.0000 mg | INTRAMUSCULAR | Status: AC | PRN
  Administered 2018-03-18: 80 mg

## 2018-03-18 MED ORDER — BUPIVACAINE HCL 0.5 % IJ SOLN
2.0000 mL | INTRAMUSCULAR | Status: AC | PRN
  Administered 2018-03-18: 2 mL via INTRA_ARTICULAR

## 2018-03-18 MED ORDER — LIDOCAINE HCL 1 % IJ SOLN
2.0000 mL | INTRAMUSCULAR | Status: AC | PRN
  Administered 2018-03-18: 2 mL

## 2018-03-18 NOTE — Progress Notes (Signed)
Office Visit Note   Patient: Tyler Malone           Date of Birth: [birth year]           MRN: [id number] Visit Date: 03/18/2018              Requested by: Ralene Ok, MD [address] [place], [place] [address] PCP: Ralene Ok, MD  There is moderate glenohumeral joint effusion and small osteophytes on the inferior aspect of the humeral head. Assessment & Plan: Visit Diagnoses:  1. Chronic right shoulder pain     Plan: MRI scan of the right shoulder demonstrates focal small intrasubstance tear of the anterior aspect of the supraspinatus.  The tear does not extend to the articular or bursal surfaces.  There are chronic degenerative changes of both the infra and supraspinatus and subscapularis tendons.  No atrophy or edema in the of the muscles.  Biceps long head intact.  Mild acromioclavicular joint arthritis.  Slight subdeltoid bursitis.  Moderate glenohumeral joint effusion with small osteophytes on the inferior aspect of the humeral head.  Long discussion with Mr. Tyler Malone regarding the above.  I think that the findings explain his pain as he does have some rotator cuff tendinitis and degenerative changes in the glenohumeral joint.  Have discussed exercises, NSAIDs and cortisone injection.  Also discussed surgical debridement including SCD, DCR debridement of the joint.  He is not ready for that yet.  Will inject the subacromial space and glenohumeral joint today  Follow-Up Instructions: Return if symptoms worsen or fail to improve.   Orders:  Orders Placed This Encounter  Procedures  . Large Joint Inj: R glenohumeral   No orders of the defined types were placed in this encounter.     Procedures: Large Joint Inj: R glenohumeral on 03/18/2018 9:20 AM Indications: pain and diagnostic evaluation Details: 25 G 1.5 in needle, posterior approach  Arthrogram: No  Medications: 2 mL lidocaine 1 %; 2 mL bupivacaine 0.5 %; 80 mg methylPREDNISolone acetate 40 MG/ML Consent was given by the  patient. Immediately prior to procedure a time out was called to verify the correct patient, procedure, equipment, support staff and site/side marked as required. Patient was prepped and draped in the usual sterile fashion.       Clinical Data: No additional findings.   Subjective: Chief Complaint  Patient presents with  . Right Shoulder - Pain  . Follow-up    mri review right shoulder  No change in symptoms does have some pain with overhead motion.  No numbness or tingling.  No referred pain from the cervical spine HPI  Review of Systems  Constitutional: Positive for fatigue.  HENT: Negative for ear pain.   Eyes: Negative for pain.  Respiratory: Negative for cough and shortness of breath.   Cardiovascular: Negative for leg swelling.  Gastrointestinal: Negative for constipation and diarrhea.  Genitourinary: Negative for difficulty urinating.  Musculoskeletal: Negative for back pain and neck pain.  Skin: Negative for rash.  Allergic/Immunologic: Negative for food allergies.  Neurological: Positive for weakness. Negative for numbness.  Hematological: Does not bruise/bleed easily.  Psychiatric/Behavioral: Positive for sleep disturbance.     Objective: Vital Signs: BP (!) 146/93 (BP Location: Left Arm, Patient Position: Sitting, Cuff Size: Normal)   Pulse 66   Resp 18   Ht 6' (1.829 m)   Wt 251 lb (113.9 kg)   BMI 34.04 kg/m   Physical Exam  Constitutional: He is oriented to person, place, and time. He appears well-developed  and well-nourished.  HENT:  Mouth/Throat: Oropharynx is clear and moist.  Eyes: Pupils are equal, round, and reactive to light. EOM are normal.  Pulmonary/Chest: Effort normal.  Neurological: He is alert and oriented to person, place, and time.  Skin: Skin is warm and dry.  Psychiatric: He has a normal mood and affect. His behavior is normal.    Ortho Exam awake alert and oriented x3 comfortable sitting.  Does have a painful overhead arc of  motion.  Good strength.  No crepitation.  Does have some pain along the anterior aspect of her shoulder in the area of the glenohumeral joint.  No pain at the acromioclavicular joint.  Good strength.  Vascular exam intact  Specialty Comments:  No specialty comments available.  Imaging: No results found.   PMFS History: There are no active problems to display for this patient.  History reviewed. No pertinent past medical history.  History reviewed. No pertinent family history.  Past Surgical History:  Procedure Laterality Date  . COLONOSCOPY    . HERNIA REPAIR    . TONSILLECTOMY AND ADENOIDECTOMY     Social History   Occupational History  . Not on file  Tobacco Use  . Smoking status: Never Smoker  . Smokeless tobacco: Never Used  Substance and Sexual Activity  . Alcohol use: Not Currently    Frequency: Never  . Drug use: Not Currently  . Sexual activity: Not on file

## 2018-03-25 ENCOUNTER — Ambulatory Visit (INDEPENDENT_AMBULATORY_CARE_PROVIDER_SITE_OTHER): Payer: 59 | Admitting: Orthopaedic Surgery

## 2018-04-04 ENCOUNTER — Encounter

## 2018-04-04 MED ORDER — VARDENAFIL HCL 20 MG PO TABS
20 | ORAL_TABLET | ORAL | 1 refills | Status: DC | PRN
Start: 2018-04-04 — End: 2018-05-22

## 2018-04-04 NOTE — Telephone Encounter (Signed)
Patient called the clinical care line requesting medication refill.  Chart reviewed and medication sent to the pharmacy.    Electronically signed by Wonda Cheng on 04/04/18 at 2:22 PM

## 2018-05-22 ENCOUNTER — Encounter

## 2018-05-23 MED ORDER — LEVITRA 20 MG PO TABS
20 MG | ORAL_TABLET | ORAL | 1 refills | Status: DC
Start: 2018-05-23 — End: 2019-06-02

## 2018-07-01 ENCOUNTER — Encounter

## 2018-07-01 MED ORDER — PRAMIPEXOLE DIHYDROCHLORIDE 0.5 MG PO TABS
0.5 MG | ORAL_TABLET | ORAL | 0 refills | Status: DC
Start: 2018-07-01 — End: 2019-06-02

## 2018-07-19 ENCOUNTER — Encounter

## 2018-08-08 ENCOUNTER — Telehealth: Payer: Self-pay | Admitting: Rheumatology

## 2018-08-08 ENCOUNTER — Telehealth (INDEPENDENT_AMBULATORY_CARE_PROVIDER_SITE_OTHER): Payer: Self-pay | Admitting: Orthopaedic Surgery

## 2018-08-08 NOTE — Telephone Encounter (Signed)
Surgical sheet on your desk.

## 2018-08-08 NOTE — Telephone Encounter (Signed)
Will complete surgical sheet

## 2018-08-08 NOTE — Telephone Encounter (Signed)
Error

## 2018-08-08 NOTE — Telephone Encounter (Signed)
Patient left a voicemail stating he is ready to schedule surgery for his shoulder.

## 2018-08-09 ENCOUNTER — Telehealth (INDEPENDENT_AMBULATORY_CARE_PROVIDER_SITE_OTHER): Payer: Self-pay | Admitting: Orthopaedic Surgery

## 2018-08-09 NOTE — Telephone Encounter (Signed)
Left message on patient's voicemail, providing him with my name and direct number for scheduling his right shoulder surgery.

## 2019-02-25 IMAGING — CR DG ORBITS FOR FOREIGN BODY
2 series · 2 of 2 positions shown · non-contrast
Comparison: None.

CLINICAL DATA: Metal working/exposure; clearance prior to MRI

EXAM:
ORBITS FOR FOREIGN BODY - 2 VIEW

[w orbit pa (1 of 2)]
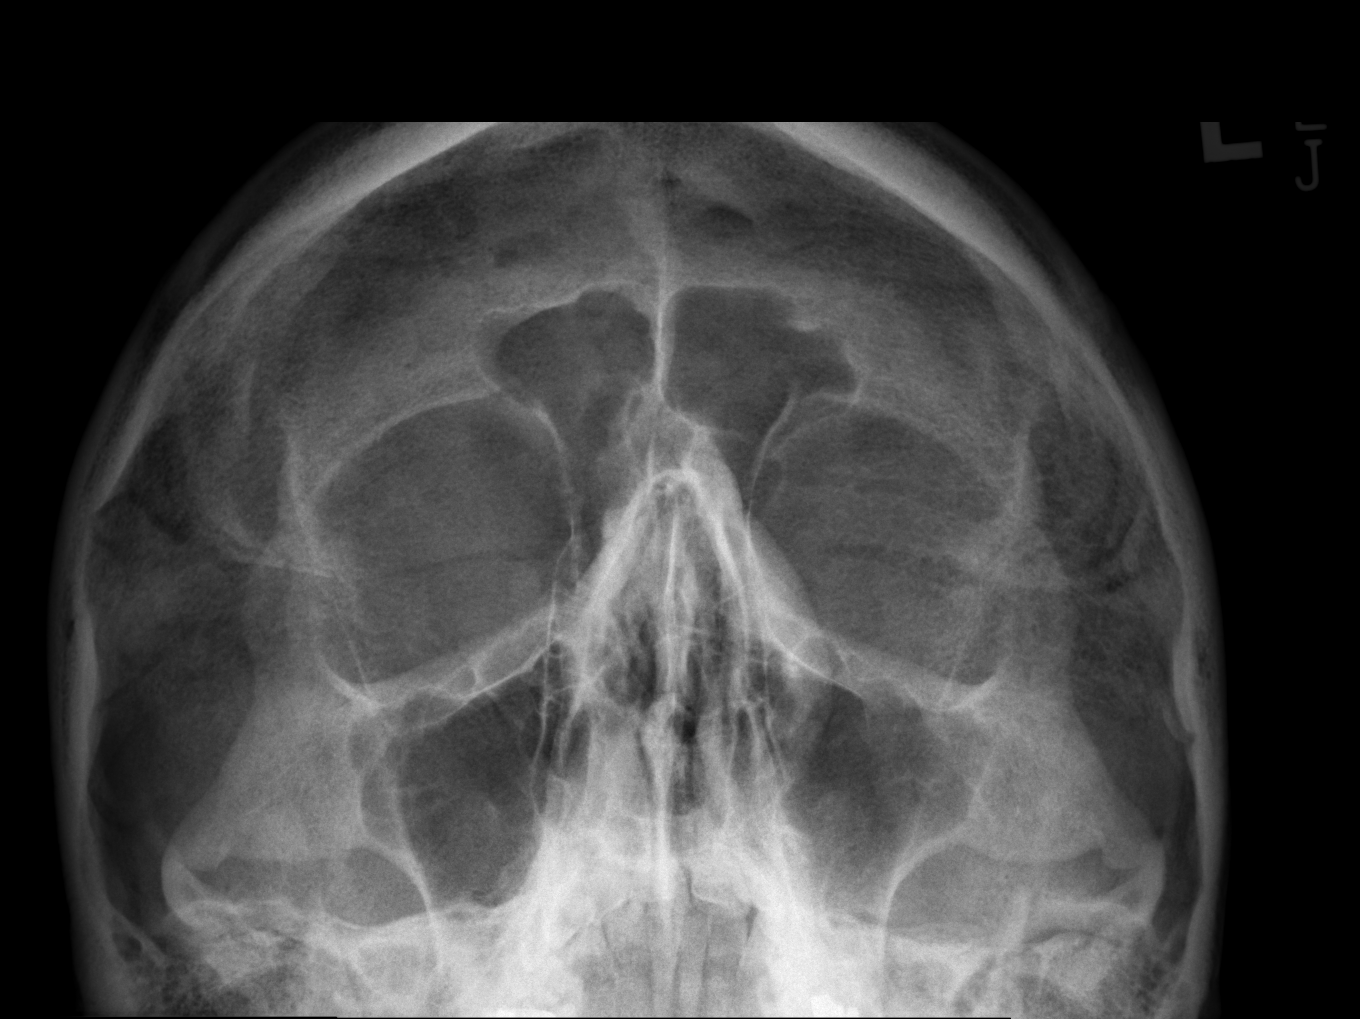

[w orbit pa (2 of 2)]
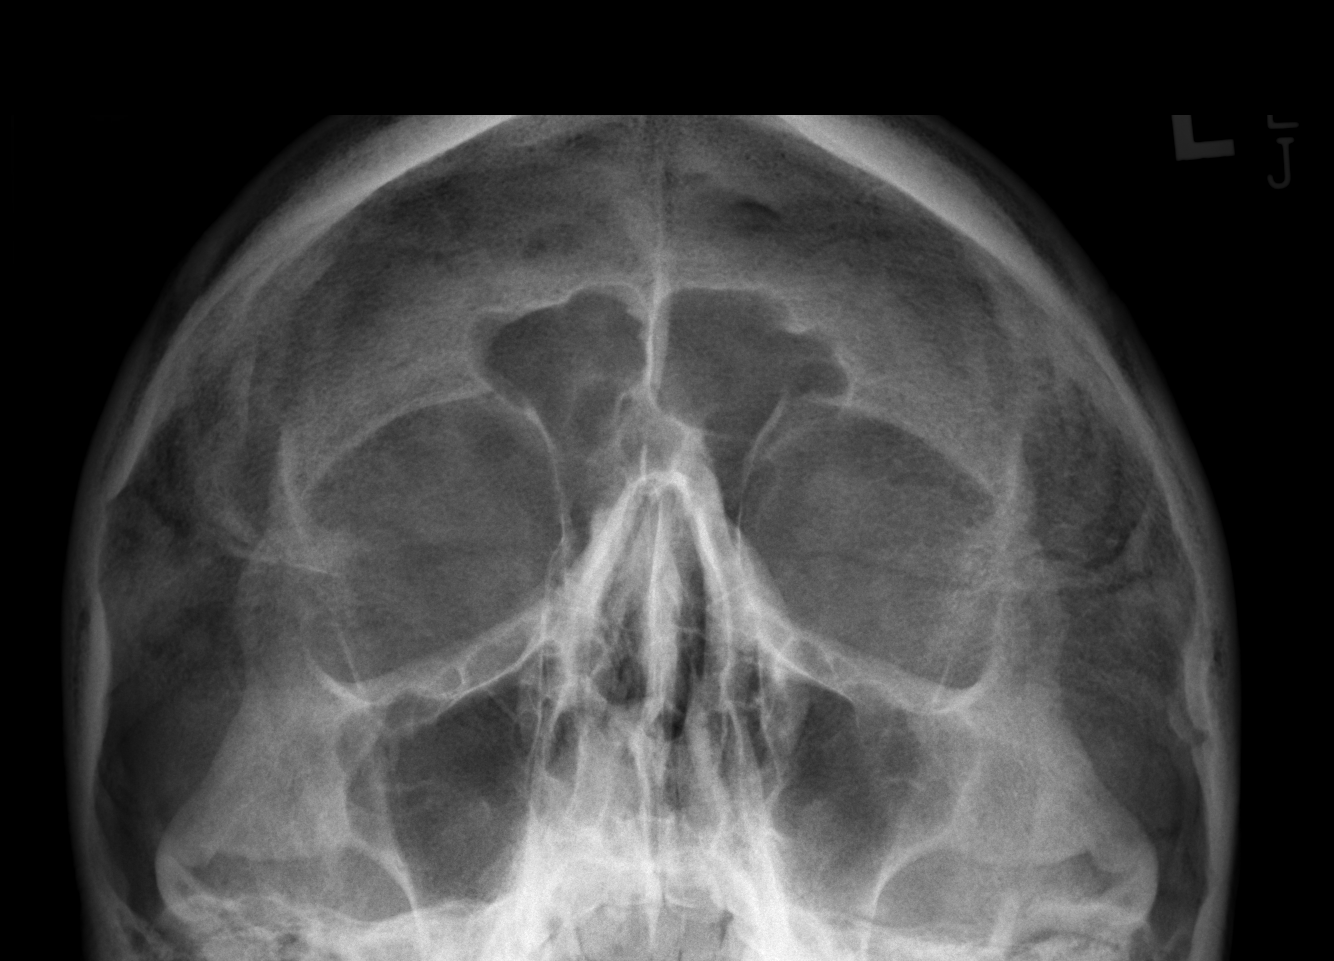

[2 of 2 positions shown; findings below may reference images not displayed]

FINDINGS: Water's views with eyes deviated toward the left and toward the
right were obtained. No intraorbital radiopaque foreign body. There
is hazy opacity in the inferior maxillary antra bilaterally,
somewhat more on the left than on the right. Other paranasal sinuses
are clear. No fracture or dislocation.
IMPRESSION: No evidence of metallic foreign body within the orbits. Hazy opacity
consistent with paranasal sinus disease in each maxillary antrum,
more on the left than on the right.

## 2019-06-02 ENCOUNTER — Telehealth
Admit: 2019-06-02 | Discharge: 2019-06-02 | Payer: PRIVATE HEALTH INSURANCE | Attending: Family Medicine | Primary: Family Medicine

## 2019-06-02 DIAGNOSIS — I10 Essential (primary) hypertension: Secondary | ICD-10-CM

## 2019-06-02 MED ORDER — PRAMIPEXOLE DIHYDROCHLORIDE 0.5 MG PO TABS
0.5 MG | ORAL_TABLET | ORAL | 1 refills | Status: DC
Start: 2019-06-02 — End: 2021-10-01

## 2019-06-02 MED ORDER — IBUPROFEN 800 MG PO TABS
800 MG | ORAL_TABLET | Freq: Three times a day (TID) | ORAL | 1 refills | Status: DC | PRN
Start: 2019-06-02 — End: 2021-10-01

## 2019-06-02 MED ORDER — VARDENAFIL HCL 20 MG PO TABS
20 MG | ORAL_TABLET | ORAL | 1 refills | Status: DC
Start: 2019-06-02 — End: 2019-07-12

## 2019-06-02 NOTE — Progress Notes (Signed)
SUBJECTIVE  Jay LogesKenneth J Maxwell is a 48 y.o. male.    HPI/Chief C/O:  Chief Complaint   Patient presents with   ??? Hyperlipidemia     Regular check up, patient recently [place] the [place].     No Known Allergies  TeleMedicine Video Visit    This visit was performed as a virtual video visit using a synchronous, two-way, audio-video telehealth technology platform. Patient identification was verified at the start of the visit, including the patient's telephone number and physical location. I discussed with the patient the nature of our telehealth visits, that:     Due to the nature of an audio- video modality, the only components of a physical exam that could be done are the elements supported by direct observation.  I would evaluate the patient and recommend diagnostics and treatments based on my assessment.  If it was felt that the patient should be evaluated in clinic or an emergency room setting, then they would be directed there.  Our sessions are not being recorded and that personal health information is protected.  Our team would provide follow up care in person if/when the patient needs it.       Patient does agree to proceed with telemedicine consultation.    Patient's location: [place] address in [place]  Physician  location home address in [place] other people involved in call, are none      Time spent: Greater than 30    This visit was completed virtually using [email or web address]  The patient is here for a medication list and treatment planning review  We will go over our care planning goals as well as take care of all refills  We will set up labs as well             Hypertension   This is a chronic problem. The current episode started more than 1 year ago. The problem is controlled. Associated symptoms include anxiety. Pertinent negatives include no blurred vision, chest pain, headaches, malaise/fatigue, neck pain, orthopnea, palpitations, peripheral edema, PND, shortness of breath or sweats. Risk factors for coronary  artery disease include male gender and stress. Past treatments include lifestyle changes. The current treatment provides significant improvement. Compliance problems include exercise, diet and psychosocial issues.  There is no history of angina, kidney disease, CAD/MI, CVA, heart failure, left ventricular hypertrophy, PVD or retinopathy. There is no history of chronic renal disease, coarctation of the aorta, hyperaldosteronism, hypercortisolism, hyperparathyroidism, a hypertension causing med, pheochromocytoma, renovascular disease, sleep apnea or a thyroid problem.       ROS:  Review of Systems   Constitutional: Negative.  Negative for activity change, appetite change, chills, diaphoresis, fatigue, fever, malaise/fatigue and unexpected weight change.   HENT: Negative.  Negative for congestion, dental problem, drooling, ear discharge, ear pain, facial swelling, hearing loss, mouth sores, nosebleeds, postnasal drip, rhinorrhea, sinus pressure, sinus pain, sneezing, sore throat, tinnitus, trouble swallowing and voice change.    Eyes: Negative for blurred vision, photophobia, pain, discharge, redness, itching and visual disturbance.   Respiratory: Negative.  Negative for apnea, cough, choking, chest tightness, shortness of breath, wheezing and stridor.    Cardiovascular: Negative.  Negative for chest pain, palpitations, orthopnea, leg swelling and PND.   Gastrointestinal: Negative.  Negative for abdominal distention, abdominal pain, anal bleeding, blood in stool, constipation, diarrhea, nausea, rectal pain and vomiting.   Endocrine: Negative.  Negative for cold intolerance, heat intolerance, polydipsia, polyphagia and polyuria.   Genitourinary: Negative.  Negative for decreased urine  volume, difficulty urinating, discharge, dysuria, enuresis, flank pain, frequency, genital sores, hematuria, penile pain, penile swelling, scrotal swelling, testicular pain and urgency.   Musculoskeletal: Negative.  Negative for  arthralgias, back pain, gait problem, joint swelling, neck pain and neck stiffness.   Skin: Negative.  Negative for color change, pallor, rash and wound.   Allergic/Immunologic: Negative.  Negative for environmental allergies, food allergies and immunocompromised state.   Neurological: Negative.  Negative for dizziness, tremors, seizures, syncope, facial asymmetry, speech difficulty, weakness, light-headedness, numbness and headaches.   Hematological: Negative.  Negative for adenopathy. Does not bruise/bleed easily.   Psychiatric/Behavioral: Negative.  Negative for agitation, behavioral problems, confusion, decreased concentration, dysphoric mood, hallucinations, self-injury, sleep disturbance and suicidal ideas. The patient is not nervous/anxious and is not hyperactive.         Past Medical/Surgical Hx;  Reviewed with patient      Diagnosis Date   ??? 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 Diverticulitis    ??? Hernia    ??? Mixed hyperlipidemia 11/17/2016   ??? Restless leg      Past Surgical History:   Procedure Laterality Date   ??? ABDOMEN SURGERY      Bowel removal   ??? APPENDECTOMY     ??? HERNIA REPAIR  2008   ??? OTHER SURGICAL HISTORY  09/19/2014    laparoscopic incisional hernia repair with mesh   ??? TONSILLECTOMY         Past Family Hx:  Reviewed with patient      Problem Relation Age of Onset   ??? Heart Disease Mother    ??? High Blood Pressure Mother    ??? Diabetes Mother    ??? High Blood Pressure Father    ??? Diabetes Father    ??? Heart Disease Father        Social Hx:  Reviewed with patient  Social History     Tobacco Use   ??? Smoking status: Never Smoker   ??? Smokeless tobacco: Never Used   Substance Use Topics   ??? Alcohol use: No     Comment: social       OBJECTIVE  There were no vitals taken for this visit.    Problem List:  Jay Maxwell does not have any pertinent problems on file.    PHYS EX:  General: Awake/Alert/Oriented/No Acute Distress      ASSESSMENT/PLAN  Jay Maxwell was seen today for  hyperlipidemia.    Diagnoses and all orders for this visit:    Essential hypertension  -     Comprehensive Metabolic Panel; Future  -     CBC Auto Differential; Future  --patient is instructed on low to moderate sodium ( 2 to 2.5 grams ), daily    Also to increase potassium in the diet to about 3.5 grams daily    Literature is provided       Restless leg  -     pramipexole (MIRAPEX) 0.5 MG tablet; TAKE 1 TABLET BY MOUTH TWICE A DAY  -     Comprehensive Metabolic Panel; Future  -     CBC Auto Differential; Future    Erectile dysfunction, unspecified erectile dysfunction type  -     vardenafil (LEVITRA) 20 MG tablet; TAKE 1 TABLET BY MOUTH AS NEEDED FOR ERECTILE DYSFUNCTION  -     Comprehensive Metabolic Panel; Future  -     CBC Auto Differential; Future    Impingement syndrome of left shoulder  -  ibuprofen (ADVIL;MOTRIN) 800 MG tablet; Take 1 tablet by mouth every 8 hours as needed for Pain  -     Comprehensive Metabolic Panel; Future  -     CBC Auto Differential; Future    Mixed hyperlipidemia  -     Comprehensive Metabolic Panel; Future  -     CBC Auto Differential; Future  --Mediterranean diet, exercise, weight loss, vitamins    We have a long talk on cholesterol and importance of lowering it       IFG (impaired fasting glucose)  -     Comprehensive Metabolic Panel; Future  -     CBC Auto Differential; Future  -     Hemoglobin A1C; Future    Dyslipidemia  -     Comprehensive Metabolic Panel; Future  -     Lipid Panel; Future  -     CBC Auto Differential; Future    Fatigue, unspecified type  -     TSH without Reflex; Future  -     Uric Acid; Future  -     Comprehensive Metabolic Panel; Future  -     CBC Auto Differential; Future    Screening PSA (prostate specific antigen)  -     Psa screening; Future        Outpatient Encounter Medications as of 06/02/2019   Medication Sig Dispense Refill   ??? pramipexole (MIRAPEX) 0.5 MG tablet TAKE 1 TABLET BY MOUTH TWICE A DAY 180 tablet 1   ??? vardenafil (LEVITRA) 20 MG tablet  TAKE 1 TABLET BY MOUTH AS NEEDED FOR ERECTILE DYSFUNCTION 30 tablet 1   ??? ibuprofen (ADVIL;MOTRIN) 800 MG tablet Take 1 tablet by mouth every 8 hours as needed for Pain 270 tablet 1   ??? [DISCONTINUED] pramipexole (MIRAPEX) 0.5 MG tablet TAKE 1 TABLET BY MOUTH TWICE A DAY 30 tablet 0   ??? [DISCONTINUED] LEVITRA 20 MG tablet TAKE 1 TABLET BY MOUTH AS NEEDED FOR ERECTILE DYSFUNCTION 6 tablet 1   ??? [DISCONTINUED] ibuprofen (ADVIL;MOTRIN) 800 MG tablet Take 1 tablet by mouth every 8 hours as needed for Pain 180 tablet 1     No facility-administered encounter medications on file as of 06/02/2019.        No follow-ups on file.        [facility]      Discussed importance of regular Health Maintenance follow up  Health Maintenance   Topic   ??? HIV screen    ??? Flu vaccine (1)   ??? Lipid screen    ??? DTaP/Tdap/Td vaccine (2 - Td)   ??? Hepatitis A vaccine    ??? Hepatitis B vaccine    ??? Hib vaccine    ??? Meningococcal (ACWY) vaccine    ??? Pneumococcal 0-64 years Vaccine

## 2019-07-12 ENCOUNTER — Ambulatory Visit
Admit: 2019-07-12 | Discharge: 2019-07-12 | Payer: PRIVATE HEALTH INSURANCE | Attending: Family Medicine | Primary: Family Medicine

## 2019-07-12 DIAGNOSIS — Z0000 Encounter for general adult medical examination without abnormal findings: Secondary | ICD-10-CM

## 2019-07-12 MED ORDER — VARDENAFIL HCL 20 MG PO TABS
20 MG | ORAL_TABLET | ORAL | 3 refills | Status: AC
Start: 2019-07-12 — End: 2023-01-14

## 2019-07-12 NOTE — Progress Notes (Signed)
SUBJECTIVE  Jay Maxwell is a 48 y.o. male.    HPI/Chief C/O:  Chief Complaint   Patient presents with   ??? Annual Exam     Pt here for insurance physical for work - has form     No Known Allergies  The patient is here for a medication list and treatment planning review  We will go over our care planning goals as well as take care of all refills  We will set up labs as well   He is here for a well adult exam    Hypertension   This is a chronic problem. The current episode started more than 1 year ago. The problem is controlled. Associated symptoms include anxiety. Pertinent negatives include no blurred vision, chest pain, headaches, malaise/fatigue, neck pain, orthopnea, palpitations, peripheral edema, PND, shortness of breath or sweats. Risk factors for coronary artery disease include male gender and stress. Past treatments include lifestyle changes. The current treatment provides significant improvement. Compliance problems include exercise, diet and psychosocial issues.  There is no history of angina, kidney disease, CAD/MI, CVA, heart failure, left ventricular hypertrophy, PVD or retinopathy. There is no history of chronic renal disease, coarctation of the aorta, hyperaldosteronism, hypercortisolism, hyperparathyroidism, a hypertension causing med, pheochromocytoma, renovascular disease, sleep apnea or a thyroid problem.       ROS:  Review of Systems   Constitutional: Negative.  Negative for activity change, appetite change, chills, diaphoresis, fatigue, fever, malaise/fatigue and unexpected weight change.   HENT: Negative.  Negative for congestion, dental problem, drooling, ear discharge, ear pain, facial swelling, hearing loss, mouth sores, nosebleeds, postnasal drip, rhinorrhea, sinus pressure, sinus pain, sneezing, sore throat, tinnitus, trouble swallowing and voice change.    Eyes: Negative for blurred vision, photophobia, pain, discharge, redness, itching and visual disturbance.   Respiratory: Negative.   Negative for apnea, cough, choking, chest tightness, shortness of breath, wheezing and stridor.    Cardiovascular: Negative.  Negative for chest pain, palpitations, orthopnea, leg swelling and PND.   Gastrointestinal: Negative.  Negative for abdominal distention, abdominal pain, anal bleeding, blood in stool, constipation, diarrhea, nausea, rectal pain and vomiting.   Endocrine: Negative.  Negative for cold intolerance, heat intolerance, polydipsia, polyphagia and polyuria.   Genitourinary: Negative.  Negative for decreased urine volume, difficulty urinating, discharge, dysuria, enuresis, flank pain, frequency, genital sores, hematuria, penile pain, penile swelling, scrotal swelling, testicular pain and urgency.   Musculoskeletal: Negative.  Negative for arthralgias, back pain, gait problem, joint swelling, myalgias, neck pain and neck stiffness.   Skin: Negative.  Negative for color change, pallor, rash and wound.   Allergic/Immunologic: Negative.  Negative for environmental allergies, food allergies and immunocompromised state.   Neurological: Negative.  Negative for dizziness, tremors, seizures, syncope, facial asymmetry, speech difficulty, weakness, light-headedness, numbness and headaches.   Hematological: Negative.  Negative for adenopathy. Does not bruise/bleed easily.   Psychiatric/Behavioral: Negative.  Negative for agitation, behavioral problems, confusion, decreased concentration, dysphoric mood, hallucinations, self-injury, sleep disturbance and suicidal ideas. The patient is not nervous/anxious and is not hyperactive.         Past Medical/Surgical Hx;  Reviewed with patient      Diagnosis Date   ??? 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 Diverticulitis    ??? Hernia    ??? Mixed hyperlipidemia 11/17/2016   ??? Restless leg      Past Surgical History:   Procedure Laterality Date   ??? ABDOMEN SURGERY      Bowel removal   ???  APPENDECTOMY     ??? HERNIA REPAIR  2008   ??? OTHER SURGICAL HISTORY   09/19/2014    laparoscopic incisional hernia repair with mesh   ??? TONSILLECTOMY         Past Family Hx:  Reviewed with patient      Problem Relation Age of Onset   ??? Heart Disease Mother    ??? High Blood Pressure Mother    ??? Diabetes Mother    ??? High Blood Pressure Father    ??? Diabetes Father    ??? Heart Disease Father        Social Hx:  Reviewed with patient  Social History     Tobacco Use   ??? Smoking status: Never Smoker   ??? Smokeless tobacco: Never Used   Substance Use Topics   ??? Alcohol use: No     Comment: social       OBJECTIVE  BP 122/80    Pulse 65    Temp 98.6 ??F (37 ??C) (Temporal)    Resp 18    Ht 6' (1.829 m)    Wt 234 lb (106.1 kg)    SpO2 98%    BMI 31.74 kg/m??     Problem List:  Royall does not have any pertinent problems on file.    PHYS EX:  Physical Exam  Vitals signs and nursing note reviewed.   Constitutional:       General: He is not in acute distress.     Appearance: Normal appearance. He is well-developed and normal weight. He is not ill-appearing, toxic-appearing or diaphoretic.   HENT:      Head: Normocephalic and atraumatic.      Right Ear: Tympanic membrane, ear canal and external ear normal. There is no impacted cerumen.      Left Ear: Tympanic membrane, ear canal and external ear normal. There is no impacted cerumen.      Nose: Nose normal. No congestion or rhinorrhea.      Mouth/Throat:      Mouth: Mucous membranes are moist.      Pharynx: Oropharynx is clear. No oropharyngeal exudate or posterior oropharyngeal erythema.   Eyes:      General: No scleral icterus.        Right eye: No discharge.         Left eye: No discharge.      Extraocular Movements: Extraocular movements intact.      Conjunctiva/sclera: Conjunctivae normal.      Pupils: Pupils are equal, round, and reactive to light.   Neck:      Musculoskeletal: Normal range of motion and neck supple. No neck rigidity or muscular tenderness.      Thyroid: No thyromegaly.      Vascular: No carotid bruit.      Trachea: No tracheal  deviation.   Cardiovascular:      Rate and Rhythm: Normal rate and regular rhythm.      Pulses: Normal pulses.      Heart sounds: Normal heart sounds. No murmur. No friction rub. No gallop.    Pulmonary:      Effort: Pulmonary effort is normal. No respiratory distress.      Breath sounds: Normal breath sounds. No stridor. No wheezing, rhonchi or rales.   Chest:      Chest wall: No tenderness.   Abdominal:      General: Bowel sounds are normal. There is no distension.      Palpations: Abdomen is soft. There is  no mass.      Tenderness: There is no abdominal tenderness. There is no right CVA tenderness, left CVA tenderness, guarding or rebound.      Hernia: No hernia is present.   Genitourinary:     Penis: Normal. No tenderness.       Scrotum/Testes: Normal.      Prostate: Normal.      Rectum: Normal.   Musculoskeletal: Normal range of motion.         General: No swelling, tenderness, deformity or signs of injury.      Right lower leg: No edema.      Left lower leg: No edema.   Lymphadenopathy:      Cervical: No cervical adenopathy.   Skin:     General: Skin is warm.      Coloration: Skin is not jaundiced or pale.      Findings: No bruising, erythema, lesion or rash.   Neurological:      General: No focal deficit present.      Mental Status: He is alert and oriented to person, place, and time.      Cranial Nerves: No cranial nerve deficit.      Sensory: No sensory deficit.      Motor: No weakness or abnormal muscle tone.      Coordination: Coordination normal.      Gait: Gait normal.      Deep Tendon Reflexes: Reflexes are normal and symmetric. Reflexes normal.   Psychiatric:         Mood and Affect: Mood normal.         Behavior: Behavior normal.         Thought Content: Thought content normal.         Judgment: Judgment normal.         ASSESSMENT/PLAN  [name] was seen today for annual exam.    Diagnoses and all orders for this visit:    Well adult exam    Erectile dysfunction, unspecified erectile dysfunction  type  -     vardenafil (LEVITRA) 20 MG tablet; TAKE 1 TABLET BY MOUTH AS NEEDED FOR ERECTILE DYSFUNCTION        Outpatient Encounter Medications as of 07/12/2019   Medication Sig Dispense Refill   ??? vardenafil (LEVITRA) 20 MG tablet TAKE 1 TABLET BY MOUTH AS NEEDED FOR ERECTILE DYSFUNCTION 5 tablet 3   ??? pramipexole (MIRAPEX) 0.5 MG tablet TAKE 1 TABLET BY MOUTH TWICE A DAY 180 tablet 1   ??? ibuprofen (ADVIL;MOTRIN) 800 MG tablet Take 1 tablet by mouth every 8 hours as needed for Pain 270 tablet 1   ??? [DISCONTINUED] vardenafil (LEVITRA) 20 MG tablet TAKE 1 TABLET BY MOUTH AS NEEDED FOR ERECTILE DYSFUNCTION 30 tablet 1     No facility-administered encounter medications on file as of 07/12/2019.        No follow-ups on file.        [facility]      Discussed importance of regular Health Maintenance follow up  Health Maintenance   Topic   ??? HIV screen    ??? Flu vaccine (1)   ??? Diabetes screen    ??? Lipid screen    ??? DTaP/Tdap/Td vaccine (2 - Td)   ??? Hepatitis A vaccine    ??? Hepatitis B vaccine    ??? Hib vaccine    ??? Meningococcal (ACWY) vaccine    ??? Pneumococcal 0-64 years Vaccine

## 2019-07-12 NOTE — Patient Instructions (Signed)
Patient Education        Well Visit, Ages 18 to 50: Care Instructions  Your Care Instructions     Physical exams can help you stay healthy. Your doctor has checked your overall health and may have suggested ways to take good care of yourself. He or she also may have recommended tests. At home, you can help prevent illness with healthy eating, regular exercise, and other steps.  Follow-up care is a key part of your treatment and safety. Be sure to make and go to all appointments, and call your doctor if you are having problems. It's also a good idea to know your test results and keep a list of the medicines you take.  How can you care for yourself at home?  ?? Reach and stay at a healthy weight. This will lower your risk for many problems, such as obesity, diabetes, heart disease, and high blood pressure.  ?? Get at least 30 minutes of physical activity on most days of the week. Walking is a good choice. You also may want to do other activities, such as running, swimming, cycling, or playing tennis or team sports. Discuss any changes in your exercise program with your doctor.  ?? Do not smoke or allow others to smoke around you. If you need help quitting, talk to your doctor about stop-smoking programs and medicines. These can increase your chances of quitting for good.  ?? Talk to your doctor about whether you have any risk factors for sexually transmitted infections (STIs). Having one sex partner (who does not have STIs and does not have sex with anyone else) is a good way to avoid these infections.  ?? Use birth control if you do not want to have children at this time. Talk with your doctor about the choices available and what might be best for you.  ?? Protect your skin from too much sun. When you're outdoors from 10 a.m. to 4 p.m., stay in the shade or cover up with clothing and a hat with a wide brim. Wear sunglasses that block UV rays. Even when it's cloudy, put broad-spectrum sunscreen (SPF 30 or higher) on any  exposed skin.  ?? See a dentist one or two times a year for checkups and to have your teeth cleaned.  ?? Wear a seat belt in the car.  Follow your doctor's advice about when to have certain tests. These tests can spot problems early.  For everyone  ?? Cholesterol. Have the fat (cholesterol) in your blood tested after age 20. Your doctor will tell you how often to have this done based on your age, family history, or other things that can increase your risk for heart disease.  ?? Blood pressure. Have your blood pressure checked during a routine doctor visit. Your doctor will tell you how often to check your blood pressure based on your age, your blood pressure results, and other factors.  ?? Vision. Talk with your doctor about how often to have a glaucoma test.  ?? Diabetes. Ask your doctor whether you should have tests for diabetes.  ?? Colon cancer. Your risk for colorectal cancer gets higher as you get older. Some experts say that adults should start regular screening at age 50 and stop at age 75. Others say to start before age 50 or continue after age 75. Talk with your doctor about your risk and when to start and stop screening.  For women  ?? Breast exam and mammogram. Talk to your doctor about   when you should have a clinical breast exam and a mammogram. Medical experts differ on whether and how often women under 50 should have these tests. Your doctor can help you decide what is right for you.  ?? Cervical cancer screening test and pelvic exam. Begin with a Pap test at age 21. The test often is part of a pelvic exam. Starting at age 30, you may choose to have a Pap test, an HPV test, or both tests at the same time (called co-testing). Talk with your doctor about how often to have testing.  ?? Tests for sexually transmitted infections (STIs). Ask whether you should have tests for STIs. You may be at risk if you have sex with more than one person, especially if your partners do not wear condoms.  For men  ?? Tests for  sexually transmitted infections (STIs). Ask whether you should have tests for STIs. You may be at risk if you have sex with more than one person, especially if you do not wear a condom.  ?? Testicular cancer exam. Ask your doctor whether you should check your testicles regularly.  ?? Prostate exam. Talk to your doctor about whether you should have a blood test (called a PSA test) for prostate cancer. Experts differ on whether and when men should have this test. Some experts suggest it if you are older than 45 and are African-American or have a father or brother who got prostate cancer when he was younger than 65.  When should you call for help?  Watch closely for changes in your health, and be sure to contact your doctor if you have any problems or symptoms that concern you.  Where can you learn more?  Go to [email or web address] and sign in to your MyChart account. Enter P072 in the Search Health Information box to learn more about "Well Visit, Ages 18 to 50: Care Instructions."     If you do not have an account, please click on the "Sign Up Now" link.  Current as of: June 16, 2018??????????????????????????????Content Version: 12.5  ?? 2006-2020 [employer].   Care instructions adapted under license by [facility]. If you have questions about a medical condition or this instruction, always ask your healthcare professional. [employer] disclaims any warranty or liability for your use of this information.

## 2019-07-26 ENCOUNTER — Inpatient Hospital Stay: Payer: PRIVATE HEALTH INSURANCE | Primary: Family Medicine

## 2019-07-26 DIAGNOSIS — Z125 Encounter for screening for malignant neoplasm of prostate: Secondary | ICD-10-CM

## 2019-07-26 LAB — CBC WITH AUTO DIFFERENTIAL
Basophils %: 0.3 % (ref 0.0–2.0)
Basophils Absolute: 0.03 E9/L (ref 0.00–0.20)
Eosinophils %: 0.5 % (ref 0.0–6.0)
Eosinophils Absolute: 0.04 E9/L — ABNORMAL LOW (ref 0.05–0.50)
Hematocrit: 51 % (ref 37.0–54.0)
Hemoglobin: 16.4 g/dL (ref 12.5–16.5)
Immature Granulocytes #: 0.03 E9/L
Immature Granulocytes %: 0.3 % (ref 0.0–5.0)
Lymphocytes %: 13.8 % — ABNORMAL LOW (ref 20.0–42.0)
Lymphocytes Absolute: 1.19 E9/L — ABNORMAL LOW (ref 1.50–4.00)
MCH: 28.6 pg (ref 26.0–35.0)
MCHC: 32.2 % (ref 32.0–34.5)
MCV: 89 fL (ref 80.0–99.9)
MPV: 9.4 fL (ref 7.0–12.0)
Monocytes %: 6.3 % (ref 2.0–12.0)
Monocytes Absolute: 0.54 E9/L (ref 0.10–0.95)
Neutrophils %: 78.8 % (ref 43.0–80.0)
Neutrophils Absolute: 6.78 E9/L (ref 1.80–7.30)
Platelets: 284 E9/L (ref 130–450)
RBC: 5.73 E12/L (ref 3.80–5.80)
RDW: 13.1 fL (ref 11.5–15.0)
WBC: 8.6 E9/L (ref 4.5–11.5)

## 2019-07-26 LAB — COMPREHENSIVE METABOLIC PANEL
ALT: 17 U/L (ref 0–40)
AST: 15 U/L (ref 0–39)
Albumin: 4.2 g/dL (ref 3.5–5.2)
Alkaline Phosphatase: 69 U/L (ref 40–129)
Anion Gap: 11 mmol/L (ref 7–16)
BUN: 14 mg/dL (ref 6–20)
CO2: 27 mmol/L (ref 22–29)
Calcium: 9.7 mg/dL (ref 8.6–10.2)
Chloride: 103 mmol/L (ref 98–107)
Creatinine: 0.9 mg/dL (ref 0.7–1.2)
GFR African American: >60
GFR Non-African American: >60 mL/min/{1.73_m2} (ref >=60)
Glucose: 90 mg/dL (ref 74–99)
Potassium: 4.5 mmol/L (ref 3.5–5.0)
Sodium: 141 mmol/L (ref 132–146)
Total Bilirubin: 0.7 mg/dL (ref 0.0–1.2)
Total Protein: 7.2 g/dL (ref 6.4–8.3)

## 2019-07-26 LAB — PSA SCREENING: PSA: 1.18 ng/mL (ref 0.00–4.00)

## 2019-07-26 LAB — URIC ACID: Uric Acid: 4.6 mg/dL (ref 3.4–7.0)

## 2019-07-26 LAB — HEMOGLOBIN A1C: Hemoglobin A1C: 5.4 % (ref 4.0–5.6)

## 2019-07-26 LAB — LIPID PANEL
Cholesterol, Total: 155 mg/dL (ref 0–199)
HDL: 42 mg/dL (ref >40)
LDL Calculated: 98 mg/dL (ref 0–99)
Triglycerides: 73 mg/dL (ref 0–149)
VLDL Cholesterol Calculated: 15 mg/dL

## 2019-07-26 LAB — TSH: TSH: 1.75 u[IU]/mL (ref 0.270–4.200)

## 2021-05-29 ENCOUNTER — Inpatient Hospital Stay
Admit: 2021-05-29 | Discharge: 2021-05-29 | Disposition: A | Discharge status: Home / Self Care | Payer: BLUE CROSS/BLUE SHIELD

## 2021-05-29 DIAGNOSIS — S46911A Strain of unspecified muscle, fascia and tendon at shoulder and upper arm level, right arm, initial encounter: Secondary | ICD-10-CM

## 2021-05-29 MED ORDER — METHYLPREDNISOLONE 4 MG PO TBPK
4 MG | ORAL_TABLET | ORAL | 0 refills | Status: DC
Start: 2021-05-29 — End: 2021-10-01

## 2021-05-29 MED ORDER — NAPROXEN 500 MG PO TABS
500 MG | ORAL_TABLET | Freq: Two times a day (BID) | ORAL | 0 refills | Status: DC
Start: 2021-05-29 — End: 2021-10-01

## 2021-05-29 NOTE — Discharge Instructions (Addendum)
Continuity of Care Form    Patient Name: Jay Maxwell   DOB:  Dec 28, 1970  MRN:  [id number]    Admit date:  05/29/2021  Discharge date:  ***    Code Status Order: Prior   Advance Directives:     Admitting Physician:  No admitting provider for patient encounter.  PCP: Rica Koyanagi, DO    Discharging Nurse: [facility] Unit/Room#: INTAKE01/INT-01  Discharging Unit Phone Number: ***    Emergency Contact:   Extended Emergency Contact Information  Primary Emergency Contact: Paul Dykes [place]  Home Phone: [phone]  Relation: Brother/Sister    Past Surgical History:  Past Surgical History:   Procedure Laterality Date    ABDOMINAL SURGERY      Bowel removal    APPENDECTOMY      HERNIA REPAIR  2008    OTHER SURGICAL HISTORY  09/19/2014    laparoscopic incisional hernia repair with mesh    TONSILLECTOMY         Immunization History:   Immunization History   Administered Date(s) Administered    COVID-19, PFIZER PURPLE top, DILUTE for use, (age 24 y+), 1mcg/0.3mL 02/07/2020, 02/28/2020    Tdap (Boostrix, Adacel) 09/05/2015       Active Problems:  Patient Active Problem List   Diagnosis Code    Small bowel obstruction (HCC) K56.609    Incisional hernia K43.2    Restless leg G25.81    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    Shoulder impingement M75.40    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    Nasal congestion R09.81    Mixed hyperlipidemia E78.2    Fatigue R53.83       Isolation/Infection:   Isolation            No Isolation          Patient Infection Status       None to display            Nurse Assessment:  Last Vital Signs: BP (!) 161/94    Pulse 81    Temp 98 ??F (36.7 ??C) (Oral)    Resp 22    SpO2 98%     Last documented pain score (0-10 scale):    Last Weight:   Wt Readings from Last 1 Encounters:   07/12/19 234 lb (106.1 kg)     Mental Status:  {IP PT MENTAL STATUS:20030}    IV Access:  {MH COC IV ACCESS:304088262}    Nursing Mobility/ADLs:  Walking   {CHP  DME WPYK:998338250}  Transfer  {CHP DME NLZJ:673419379}  Bathing  {CHP DME KWIO:973532992}  Dressing  {CHP DME EQAS:341962229}  Toileting  {CHP DME NLGX:211941740}  Feeding  {CHP DME CXKG:818563149}  Med Admin  {CHP DME FWYO:378588502}  Med Delivery   {MH COC MED Delivery:304088264}    Wound Care Documentation and Therapy:  Incision 09/19/14 Abdomen Left;Mid;Right (Active)   Number of days: 2443        Elimination:  Continence:   Bowel: {YES / DX:41287}  Bladder: {YES / OM:76720}  Urinary Catheter: {Urinary Catheter:304088013}   Colostomy/Ileostomy/Ileal Conduit: {YES / NO:70962}       Date of Last BM: ***  No intake or output data in the 24 hours ending 05/29/21 1116  No intake/output data recorded.    Safety Concerns:     {MH COC Safety Concerns:304088272}    Impairments/Disabilities:      {MH COC Impairments/Disabilities:304088273}    Nutrition  Therapy:  Current Nutrition Therapy:   {MH COC Diet List:304088271}    Routes of Feeding: {CHP DME Other Feedings:304088042}  Liquids: {Slp liquid thickness:30034}  Daily Fluid Restriction: {CHP DME Yes amt example:304088041}  Last Modified Barium Swallow with Video (Video Swallowing Test): {Done Not Done OZDG:644034742}    Treatments at the Time of Hospital Discharge:   Respiratory Treatments: ***  Oxygen Therapy:  {Therapy; copd oxygen:17808}  Ventilator:    {MH CC Vent VZDG:387564332}    Rehab Therapies: {THERAPEUTIC INTERVENTION:937-052-1415}  Weight Bearing Status/Restrictions: {MH CC Weight Bearing:304508812}  Other Medical Equipment (for information only, NOT a DME order):  {EQUIPMENT:304520077}  Other Treatments: ***    Patient's personal belongings (please select all that are sent with patient):  {CHP DME Belongings:304088044}    RN SIGNATURE:  {Esignature:304088025}    CASE MANAGEMENT/SOCIAL WORK SECTION    Inpatient Status Date: ***    Readmission Risk Assessment Score:  Readmission Risk              Risk of Unplanned Readmission:  0           Discharging to  Facility/ Agency   Name:   Address:  Phone:  Fax:    Dialysis Facility (if applicable)   Name:  Address:  Dialysis Schedule:  Phone:  Fax:    Case Manager/Social Worker signature: {Esignature:304088025}    PHYSICIAN SECTION    Prognosis: {Prognosis:443 717 2015}    Condition at Discharge: {MH Patient Condition:304088024}    Rehab Potential (if transferring to Rehab): {Prognosis:443 717 2015}    Recommended Labs or Other Treatments After Discharge: ***    Physician Certification: I certify the above information and transfer of WINN MUEHL  is necessary for the continuing treatment of the diagnosis listed and that he requires {Admit to Appropriate Level of Care:20763} for {GREATER/LESS:304500278} 30 days.     Update Admission H&P: {CHP DME Changes in RJJOA:416606301}    PHYSICIAN SIGNATURE:  {Esignature:304088025}

## 2021-05-29 NOTE — ED Provider Notes (Signed)
[facility]       [facility]  Department of Emergency Medicine   ED  Encounter Note  Admit Date/RoomTime: 05/29/2021 10:53 AM  ED Room: INTAKE01/INT-01    NAME: Jay Maxwell  DOB: [birth year]  MRN: [id number]     Chief Complaint:  Back Pain (Upper back / r scapula pain x2 weeks. No noted injury. )    History of Present Illness       Jay Maxwell is a 50 y.o. old male who presents to the emergency department by private vehicle, for non-traumatic Right shoulder pain which occured 2 week(s) prior to arrival.  The complaint is due to no known cause.  Patient  has a prior history of pain to mentioned area related to today's visit.  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  He states he does repetitive movements with his right upper extremity daily and that headache has had previous episodes of self-limiting right shoulder and right scapular pain in the past for which she has not seen orthopedics thus far.  Patient also reports that he has had arthritis diagnosed within the shoulder several years prior.  Patient denies any surgical intervention over the area. He reports that these episodes tend to resolve on their own however this 1 seems more significant as it is worsening. He is right handed. The patients tetanus status is unknown.   Since onset the symptoms have been gradually worsening.  His pain is aggraveated by certain movements or pressure on or palpation of painful area and relieved by nothing. He denies any chest pain shortness of breath fever chills nausea vomiting diarrhea numbness or tingling in right upper extremity headache or neck pain..    ROS   Pertinent positives and negatives are stated within HPI, all other systems reviewed and are negative.    Past Medical History:  has a past medical history of Arthritis of right acromioclavicular joint, Diverticulitis, Hernia, Mixed hyperlipidemia, and Restless leg.    Surgical History:  has a past surgical history that  includes hernia repair (2008); Appendectomy; Abdomen surgery; other surgical history (09/19/2014); and Tonsillectomy.    Social History:  reports that he has never smoked. He has never used smokeless tobacco. He reports that he does not drink alcohol and does not use drugs.    Family History: family history includes Diabetes in his father and mother; Heart Disease in his father and mother; High Blood Pressure in his father and mother.     Allergies: Patient has no known allergies.    Physical Exam   Oxygen Saturation Interpretation: Normal.        ED Triage Vitals [05/29/21 1041]   BP Temp Temp Source Heart Rate Resp SpO2 Height Weight   (!) 161/94 98 ??F (36.7 ??C) Oral 81 22 98 % -- --         Constitutional:  Alert, development consistent with age.  Neck:  Normal ROM.  Supple. Non-tender.  Physical Exam  Right Shoulder: posterior, supraspinatus.              Tenderness: mild              Swelling: None.            Deformity: no deformity observed/palpated.              ROM: full range with pain.             Skin:  no wounds,  erythema, or swelling.        Neurovascular:              Motor deficit: none.             Sensory deficit: none.              Pulse deficit: none.              Capillary refill: normal.    Right Elbow: diffusely across elbow            Tenderness:  none.              Swelling: None.            Deformity: no deformity observed/palpated.             ROM: full painless ROM.             Skin:  no wounds, erythema, or swelling.  Lymphatics: No lymphangitis or edema noted  Neurological:  Oriented.  Motor functions intact.    Lab / Imaging Results   (All laboratory and radiology results have been personally reviewed by myself)  Labs:  No results found for this visit on 05/29/21.  Imaging:  All Radiology results interpreted by Radiologist unless otherwise noted.  No orders to display     ED Course / Medical Decision Making   Medications - No data to display        Consult(s):   None    Procedure(s):   None    MDM:      Imaging was not obtained based on no suspicion for fracture / bony abnormality as per history/physical findings.  Reason for refraining from x-ray imaging at this time was discussed with the patient who was agreeable.  There is no new injury and patient has no findings of bony abnormality on exam.  He has full range of motion although he does verbalize some discomfort with this.  At this time based on chief complaint reported history and physical exam findings, as noted above, patient has most likely sustained a right shoulder strain due to repetitive motions perform daily.  Etiology of this being a self-limiting condition was discussed with the patient prior to disposition.  He was sent prescriptions for steroid and anti-inflammatory medication to pharmacy of choice which is to be taken as directed in its entirety.  He was given orthopedic follow-up to present to within a week for further evaluation.  He is appropriate for discharged home as he is alert and oriented in no acute distress and afebrile.  Patient states he is understandable and agreeable to this plan.      Plan of Care/Counseling:  Jess Barters, PA reviewed today's visit with the patient in addition to providing specific details for the plan of care and counseling regarding the diagnosis and prognosis.  Questions are answered at this time and are agreeable with the plan.    Assessment      1. Strain of right shoulder, initial encounter      Plan   Discharged home.  Patient condition is good    New Medications     New Prescriptions    METHYLPREDNISOLONE (MEDROL, PAK,) 4 MG TABLET    Take by mouth as directed on packaging.    NAPROXEN (NAPROSYN) 500 MG TABLET    Take 1 tablet by mouth in the morning and 1 tablet in the evening. Take with meals. Do all this for 5 days.  Electronically signed by Jess Barters, PA   DD: 05/29/21  **This report was transcribed using voice recognition software.  Every effort was made to ensure accuracy; however, inadvertent computerized transcription errors may be present.  END OF ED PROVIDER NOTE         Jess Barters, PA  05/29/21 [address], [place]  05/29/21 1123

## 2021-10-01 ENCOUNTER — Ambulatory Visit
Admit: 2021-10-01 | Discharge: 2021-10-01 | Payer: BLUE CROSS/BLUE SHIELD | Attending: Family Medicine | Primary: Family Medicine

## 2021-10-01 DIAGNOSIS — R059 Cough, unspecified: Secondary | ICD-10-CM

## 2021-10-01 LAB — POCT INFLUENZA A/B
Influenza A Ab: NEGATIVE
Influenza B Ab: NEGATIVE

## 2021-10-01 MED ORDER — METHYLPREDNISOLONE 4 MG PO TBPK
4 MG | PACK | ORAL | 0 refills | Status: AC
Start: 2021-10-01 — End: 2021-10-07

## 2021-10-01 MED ORDER — DEXAMETHASONE SODIUM PHOSPHATE 4 MG/ML IJ SOLN
4 MG/ML | Freq: Once | INTRAMUSCULAR | Status: AC
Start: 2021-10-01 — End: 2021-10-01
  Administered 2021-10-01: 16:00:00 4 mg via INTRAMUSCULAR

## 2021-10-01 MED ORDER — AZITHROMYCIN 250 MG PO TABS
250 MG | ORAL_TABLET | ORAL | 0 refills | Status: AC
Start: 2021-10-01 — End: 2021-10-06

## 2021-10-01 MED ORDER — BENZONATATE 100 MG PO CAPS
100 MG | ORAL_CAPSULE | Freq: Two times a day (BID) | ORAL | 0 refills | Status: AC | PRN
Start: 2021-10-01 — End: 2021-10-11

## 2021-10-04 NOTE — Progress Notes (Addendum)
SUBJECTIVE  Jay Maxwell is a 50 y.o. male.    HPI/Chief C/O:  Chief Complaint   Patient presents with    URI     Pt presents to the office for a cough, congestion, fatigue and headaches. Pt states it has been over a week.     No Known Allergies    This 50 year old male presents with nasal congestion, cough, sinus pain, and fatigue for 1 week. Pt denies chest pain and denies shortness of breath.     ROS:  Review of Systems   Constitutional:  Positive for activity change and fatigue. Negative for appetite change, chills, diaphoresis, fever and unexpected weight change.   HENT:  Positive for congestion, sinus pressure and sinus pain. Negative for dental problem, drooling, ear discharge, ear pain, facial swelling, hearing loss, mouth sores, nosebleeds, postnasal drip, rhinorrhea, sneezing, sore throat, tinnitus, trouble swallowing and voice change.    Eyes:  Negative for photophobia, pain, discharge, redness, itching and visual disturbance.   Respiratory:  Positive for cough. Negative for apnea, choking, chest tightness, shortness of breath, wheezing and stridor.    Cardiovascular: Negative.  Negative for chest pain, palpitations and leg swelling.   Gastrointestinal: Negative.  Negative for abdominal distention, abdominal pain, anal bleeding, blood in stool, constipation, diarrhea and nausea.   Endocrine: Negative.  Negative for cold intolerance, heat intolerance, polydipsia, polyphagia and polyuria.   Genitourinary: Negative.  Negative for decreased urine volume, difficulty urinating, dysuria, enuresis, flank pain, frequency, genital sores, hematuria, penile discharge, penile pain, penile swelling, scrotal swelling, testicular pain and urgency.   Musculoskeletal: Negative.  Negative for arthralgias, back pain, gait problem, joint swelling, myalgias, neck pain and neck stiffness.   Skin: Negative.  Negative for color change, pallor, rash and wound.   Allergic/Immunologic: Negative.  Negative for environmental  allergies, food allergies and immunocompromised state.   Neurological:  Positive for headaches. Negative for dizziness, tremors, seizures, syncope, facial asymmetry, speech difficulty, weakness and light-headedness.   Hematological: Negative.  Negative for adenopathy. Does not bruise/bleed easily.   Psychiatric/Behavioral:  Negative for agitation, behavioral problems, confusion, decreased concentration, dysphoric mood, hallucinations, self-injury, sleep disturbance and suicidal ideas. The patient is nervous/anxious. The patient is not hyperactive.       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    Diverticulitis     Hernia     Mixed hyperlipidemia 11/17/2016    Restless leg      Past Surgical History:   Procedure Laterality Date    ABDOMEN SURGERY      Bowel removal    APPENDECTOMY      HERNIA REPAIR  2008    OTHER SURGICAL HISTORY  09/19/2014    laparoscopic incisional hernia repair with mesh    TONSILLECTOMY         Past Family Hx:  Reviewed with patient      Problem Relation Age of Onset    Heart Disease Mother     High Blood Pressure Mother     Diabetes Mother     High Blood Pressure Father     Diabetes Father     Heart Disease Father        Social Hx:  Reviewed with patient  Social History     Tobacco Use    Smoking status: Never    Smokeless tobacco: Never   Substance Use Topics    Alcohol  use: No     Comment: social       OBJECTIVE  BP 138/86    Pulse 99    Temp 97.6 ??F (36.4 ??C) (Temporal)    Resp 18    Ht 6' (1.829 m)    Wt 284 lb (128.8 kg)    SpO2 95%    BMI 38.52 kg/m??     Problem List:  [name] does not have any pertinent problems on file.    PHYS EX:  Physical Exam  Vitals and nursing note reviewed.   Constitutional:       General: He is not in acute distress.     Appearance: Normal appearance. He is well-developed. He is obese. He is not ill-appearing, toxic-appearing or diaphoretic.      Comments: Patient has morbid  obesity. Patient instructed on low calorie, healthy diet.        HENT:      Head: Normocephalic and atraumatic.      Right Ear: Tympanic membrane, ear canal and external ear normal. There is no impacted cerumen.      Left Ear: Tympanic membrane, ear canal and external ear normal. There is no impacted cerumen.      Nose: Congestion and rhinorrhea present.      Mouth/Throat:      Mouth: Mucous membranes are moist.      Pharynx: Oropharynx is clear. Posterior oropharyngeal erythema present. No oropharyngeal exudate.   Eyes:      General: No scleral icterus.        Right eye: No discharge.         Left eye: No discharge.      Conjunctiva/sclera: Conjunctivae normal.      Pupils: Pupils are equal, round, and reactive to light.   Neck:      Thyroid: No thyromegaly.      Vascular: No carotid bruit or JVD.      Trachea: No tracheal deviation.   Cardiovascular:      Rate and Rhythm: Normal rate and regular rhythm.      Pulses: Normal pulses.      Heart sounds: Normal heart sounds. No murmur heard.    No friction rub. No gallop.   Pulmonary:      Effort: Pulmonary effort is normal. No respiratory distress.      Breath sounds: Normal breath sounds. No stridor. No wheezing, rhonchi or rales.   Chest:      Chest wall: No tenderness.   Abdominal:      General: Bowel sounds are normal. There is no distension.      Palpations: Abdomen is soft. There is no mass.      Tenderness: There is no abdominal tenderness. There is no right CVA tenderness, left CVA tenderness, guarding or rebound.      Hernia: No hernia is present.   Genitourinary:     Penis: Normal.       Prostate: Normal.      Rectum: Normal.   Musculoskeletal:         General: No swelling, tenderness, deformity or signs of injury. Normal range of motion.      Cervical back: Normal range of motion and neck supple. No rigidity. No muscular tenderness.      Right lower leg: No edema.      Left lower leg: No edema.   Lymphadenopathy:      Cervical: No cervical adenopathy.    Skin:     General: Skin is warm.  Coloration: Skin is not jaundiced or pale.      Findings: No bruising, erythema, lesion or rash.   Neurological:      General: No focal deficit present.      Mental Status: He is alert and oriented to person, place, and time.      Cranial Nerves: No cranial nerve deficit.      Sensory: No sensory deficit.      Motor: No weakness or abnormal muscle tone.      Coordination: Coordination normal.      Gait: Gait normal.      Deep Tendon Reflexes: Reflexes are normal and symmetric. Reflexes normal.   Psychiatric:         Mood and Affect: Mood normal.         Behavior: Behavior normal.         Thought Content: Thought content normal.         Judgment: Judgment normal.       ASSESSMENT/PLAN  Jay Maxwell was seen today for uri.    Diagnoses and all orders for this visit:    Cough, unspecified type  -     POCT Influenza A/B  -     benzonatate (TESSALON) 100 MG capsule; Take 1 capsule by mouth 2 times daily as needed for Cough    Other fatigue  -     POCT Influenza A/B    Bronchitis  -     azithromycin (ZITHROMAX) 250 MG tablet; Take 1 tablet by mouth See Admin Instructions for 5 days 546m on day 1 followed by 2527mon days 2 - 5  -     benzonatate (TESSALON) 100 MG capsule; Take 1 capsule by mouth 2 times daily as needed for Cough  -     methylPREDNISolone (MEDROL DOSEPACK) 4 MG tablet; Take as directed    Other orders  -     dexamethasone (DECADRON) injection 4 mg    Pt instructed if any worse go ED ASAP.    Outpatient Encounter Medications as of 10/01/2021   Medication Sig Dispense Refill    azithromycin (ZITHROMAX) 250 MG tablet Take 1 tablet by mouth See Admin Instructions for 5 days 50020mn day 1 followed by 250m34m days 2 - 5 6 tablet 0    benzonatate (TESSALON) 100 MG capsule Take 1 capsule by mouth 2 times daily as needed for Cough 20 capsule 0    methylPREDNISolone (MEDROL DOSEPACK) 4 MG tablet Take as directed 1 kit 0    vardenafil (LEVITRA) 20 MG tablet TAKE 1 TABLET BY MOUTH  AS NEEDED FOR ERECTILE DYSFUNCTION 5 tablet 3    [DISCONTINUED] methylPREDNISolone (MEDROL, PAK,) 4 MG tablet Take by mouth as directed on packaging. (Patient not taking: Reported on 10/01/2021) 21 tablet 0    [DISCONTINUED] naproxen (NAPROSYN) 500 MG tablet Take 1 tablet by mouth in the morning and 1 tablet in the evening. Take with meals. Do all this for 5 days. (Patient not taking: Reported on 10/01/2021) 10 tablet 0    [DISCONTINUED] pramipexole (MIRAPEX) 0.5 MG tablet TAKE 1 TABLET BY MOUTH TWICE A DAY (Patient not taking: Reported on 10/01/2021) 180 tablet 1    [DISCONTINUED] ibuprofen (ADVIL;MOTRIN) 800 MG tablet Take 1 tablet by mouth every 8 hours as needed for Pain (Patient not taking: Reported on 10/01/2021) 270 tablet 1    [EXPIRED] dexamethasone (DECADRON) injection 4 mg        No facility-administered encounter medications on file as of 10/01/2021.  Return if symptoms worsen or fail to improve.        Reviewed recent labs related to [facility] current problems      Discussed importance of regular Health Maintenance follow up  Health Maintenance   Topic    HIV screen     Hepatitis C screen     Colorectal Cancer Screen     Shingles vaccine (1 of 2)    COVID-19 Vaccine (3 - Booster for [employer] series)    Flu vaccine (1)    Depression Screen     Lipids     DTaP/Tdap/Td vaccine (2 - Td or Tdap)    Hepatitis A vaccine     Hib vaccine     Meningococcal (ACWY) vaccine     Pneumococcal 0-64 years Vaccine

## 2022-02-24 ENCOUNTER — Ambulatory Visit
Admit: 2022-02-24 | Discharge: 2022-02-24 | Payer: BLUE CROSS/BLUE SHIELD | Attending: Family Medicine | Primary: Family Medicine

## 2022-02-24 DIAGNOSIS — I10 Essential (primary) hypertension: Secondary | ICD-10-CM

## 2022-02-24 MED ORDER — AMLODIPINE BESYLATE 5 MG PO TABS
5 MG | ORAL_TABLET | Freq: Every day | ORAL | 1 refills | Status: AC
Start: 2022-02-24 — End: 2022-08-26

## 2022-02-24 MED ORDER — IBUPROFEN 600 MG PO TABS
600 MG | ORAL_TABLET | Freq: Three times a day (TID) | ORAL | 5 refills | Status: DC | PRN
Start: 2022-02-24 — End: 2023-03-15

## 2022-02-24 MED ORDER — ACETAMINOPHEN 325 MG PO TABS
325 MG | ORAL_TABLET | ORAL | 3 refills | Status: AC
Start: 2022-02-24 — End: 2023-01-14

## 2022-02-24 NOTE — Progress Notes (Signed)
SUBJECTIVE  Jay Maxwell is a 51 y.o. male.    HPI/Chief C/O:  Chief Complaint   Patient presents with    Knee Pain     Pt here due to left knee pain and swelling for a couple weeks     No Known Allergies  The patient is here for a medication list and treatment planning review  We will go over our care planning goals as well as take care of all refills  We will set up labs as well     C/O left knee pain       Hypertension  This is a chronic problem. The current episode started more than 1 year ago. Pertinent negatives include no anxiety, blurred vision, chest pain, headaches, malaise/fatigue, neck pain, orthopnea, palpitations, peripheral edema, PND, shortness of breath or sweats. Risk factors for coronary artery disease include obesity, male gender and stress. Past treatments include calcium channel blockers. Compliance problems include diet and exercise.  There is no history of angina, kidney disease, CAD/MI, CVA, heart failure, left ventricular hypertrophy, PVD or retinopathy. There is no history of chronic renal disease, coarctation of the aorta, hyperaldosteronism, hypercortisolism, hyperparathyroidism, a hypertension causing med, pheochromocytoma, renovascular disease, sleep apnea or a thyroid problem.     ROS:  Review of Systems   Constitutional: Negative.  Negative for activity change, appetite change, chills, diaphoresis, fatigue, fever, malaise/fatigue and unexpected weight change.   HENT: Negative.  Negative for congestion, dental problem, drooling, ear discharge, ear pain, facial swelling, hearing loss, mouth sores, nosebleeds, postnasal drip, rhinorrhea, sinus pressure, sinus pain, sneezing, sore throat, tinnitus, trouble swallowing and voice change.    Eyes:  Negative for blurred vision, photophobia, pain, discharge, redness, itching and visual disturbance.   Respiratory: Negative.  Negative for apnea, cough, choking, chest tightness, shortness of breath, wheezing and stridor.    Cardiovascular:  Negative.  Negative for chest pain, palpitations, orthopnea, leg swelling and PND.   Gastrointestinal: Negative.  Negative for abdominal distention, abdominal pain, anal bleeding, blood in stool, constipation, diarrhea and nausea.   Endocrine: Negative.  Negative for cold intolerance, heat intolerance, polydipsia, polyphagia and polyuria.   Genitourinary: Negative.  Negative for decreased urine volume, difficulty urinating, dysuria, enuresis, flank pain, frequency, genital sores, hematuria, penile discharge, penile pain, penile swelling, scrotal swelling, testicular pain and urgency.   Musculoskeletal:  Positive for arthralgias (left knee pain). Negative for back pain, gait problem, joint swelling, myalgias, neck pain and neck stiffness.   Skin: Negative.  Negative for color change, pallor, rash and wound.   Allergic/Immunologic: Negative.  Negative for environmental allergies, food allergies and immunocompromised state.   Neurological: Negative.  Negative for dizziness, tremors, seizures, syncope, facial asymmetry, speech difficulty, weakness, light-headedness, numbness and headaches.   Hematological: Negative.  Negative for adenopathy. Does not bruise/bleed easily.   Psychiatric/Behavioral: Negative.  Negative for agitation, behavioral problems, confusion, decreased concentration, dysphoric mood, hallucinations, self-injury, sleep disturbance and suicidal ideas. The patient is not nervous/anxious and is not hyperactive.       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    Diverticulitis     Hernia     Mixed hyperlipidemia 11/17/2016    Restless leg      Past Surgical History:   Procedure Laterality Date    ABDOMEN SURGERY      Bowel removal    APPENDECTOMY      HERNIA REPAIR  2008    OTHER SURGICAL HISTORY  09/19/2014    laparoscopic incisional hernia repair with mesh    TONSILLECTOMY         Past Family Hx:  Reviewed with patient       Problem Relation Age of Onset    Heart Disease Mother     High Blood Pressure Mother     Diabetes Mother     High Blood Pressure Father     Diabetes Father     Heart Disease Father        Social Hx:  Reviewed with patient  Social History     Tobacco Use    Smoking status: Never    Smokeless tobacco: Never   Substance Use Topics    Alcohol use: No     Comment: social       OBJECTIVE  BP (!) 142/92   Pulse 82   Temp 97.5 F (36.4 C)   Resp 18   Ht 6' (1.829 m)   Wt 288 lb (130.6 kg)   SpO2 98%   BMI 39.06 kg/m     Problem List:  Jay Maxwell does not have any pertinent problems on file.    PHYS EX:  Physical Exam  Vitals and nursing note reviewed.   Constitutional:       General: He is not in acute distress.     Appearance: Normal appearance. He is well-developed. He is obese. He is not ill-appearing, toxic-appearing or diaphoretic.      Comments: Patient has morbid obesity. Patient instructed on low calorie, healthy diet.        HENT:      Head: Normocephalic and atraumatic.      Right Ear: External ear normal.      Left Ear: External ear normal.      Nose: No congestion or rhinorrhea.      Mouth/Throat:      Mouth: Mucous membranes are moist.      Pharynx: Oropharynx is clear. No oropharyngeal exudate or posterior oropharyngeal erythema.   Eyes:      General: No scleral icterus.        Right eye: No discharge.         Left eye: No discharge.      Conjunctiva/sclera: Conjunctivae normal.      Pupils: Pupils are equal, round, and reactive to light.   Neck:      Thyroid: No thyromegaly.      Vascular: No carotid bruit or JVD.      Trachea: No tracheal deviation.   Cardiovascular:      Rate and Rhythm: Normal rate and regular rhythm.      Pulses: Normal pulses.      Heart sounds: Normal heart sounds. No murmur heard.    No friction rub. No gallop.   Pulmonary:      Effort: Pulmonary effort is normal. No respiratory distress.      Breath sounds: Normal breath sounds. No stridor. No wheezing, rhonchi or rales.    Chest:      Chest wall: No tenderness.   Abdominal:      General: Bowel sounds are normal. There is no distension.      Palpations: Abdomen is soft. There is no mass.      Tenderness: There is no abdominal tenderness. There is no right CVA tenderness, left CVA tenderness, guarding or rebound.      Hernia: No hernia is present.   Musculoskeletal:  General: Tenderness (left knee pain) present. No swelling, deformity or signs of injury. Normal range of motion.      Cervical back: Normal range of motion and neck supple. No rigidity. No muscular tenderness.      Right lower leg: No edema.      Left lower leg: No edema.   Lymphadenopathy:      Cervical: No cervical adenopathy.   Skin:     General: Skin is warm.      Coloration: Skin is not jaundiced or pale.      Findings: No bruising, erythema, lesion or rash.   Neurological:      General: No focal deficit present.      Mental Status: He is alert and oriented to person, place, and time.      Cranial Nerves: No cranial nerve deficit.      Sensory: No sensory deficit.      Motor: No weakness or abnormal muscle tone.      Coordination: Coordination normal.      Gait: Gait normal.      Deep Tendon Reflexes: Reflexes are normal and symmetric. Reflexes normal.       ASSESSMENT/PLAN  Jay Maxwell was seen today for knee pain.    Diagnoses and all orders for this visit:    Primary hypertension  --patient is instructed on low to moderate sodium ( 2 to 2.5 grams ), daily    Also to increase potassium in the diet to about 3.5 grams daily    Literature is provided     -     amLODIPine (NORVASC) 5 MG tablet; Take 1 tablet by mouth daily  -     Comprehensive Metabolic Panel; Future  -     CBC with Auto Differential; Future    Screen for colon cancer  -     Fecal DNA Colorectal cancer screening (Cologuard)  -     Comprehensive Metabolic Panel; Future  -     CBC with Auto Differential; Future    Acute pain of left knee  -     XR KNEE LEFT (1-2 VIEWS); Future  -     Comprehensive  Metabolic Panel; Future  -     CBC with Auto Differential; Future    Prediabetes  -     Comprehensive Metabolic Panel; Future  -     CBC with Auto Differential; Future  -     Hemoglobin A1C; Future    Dyslipidemia  -     Comprehensive Metabolic Panel; Future  -     Lipid Panel; Future  -     CBC with Auto Differential; Future    Screening PSA (prostate specific antigen)  -     Comprehensive Metabolic Panel; Future  -     CBC with Auto Differential; Future  -     PSA Screening; Future    Other fatigue  -     TSH; Future  -     Comprehensive Metabolic Panel; Future  -     CBC with Auto Differential; Future    Other orders  -     ibuprofen (ADVIL;MOTRIN) 600 MG tablet; Take 1 tablet by mouth 3 times daily as needed for Pain  -     acetaminophen (AMINOFEN) 325 MG tablet; Take TWO with Ibuprofen every 8 hours as needed        Outpatient Encounter Medications as of 02/24/2022   Medication Sig Dispense Refill    amLODIPine (NORVASC) 5 MG  tablet Take 1 tablet by mouth daily 90 tablet 1    ibuprofen (ADVIL;MOTRIN) 600 MG tablet Take 1 tablet by mouth 3 times daily as needed for Pain 90 tablet 5    acetaminophen (AMINOFEN) 325 MG tablet Take TWO with Ibuprofen every 8 hours as needed 120 tablet 3    vardenafil (LEVITRA) 20 MG tablet TAKE 1 TABLET BY MOUTH AS NEEDED FOR ERECTILE DYSFUNCTION 5 tablet 3     No facility-administered encounter medications on file as of 02/24/2022.       Return in about 2 weeks (around 03/10/2022).        Reviewed recent labs related to [facility] current problems      Discussed importance of regular Health Maintenance follow up  Health Maintenance   Topic    HIV screen     Hepatitis C screen     Colorectal Cancer Screen     Shingles vaccine (1 of 2)    COVID-19 Vaccine (3 - Booster for [employer] series)    Flu vaccine (Season Ended)    Depression Screen     Lipids     DTaP/Tdap/Td vaccine (2 - Td or Tdap)    Hepatitis A vaccine     Hib vaccine     Meningococcal (ACWY) vaccine     Pneumococcal 0-64 years  Vaccine     Diabetes screen

## 2022-02-25 ENCOUNTER — Inpatient Hospital Stay: Admit: 2022-02-25 | Payer: BLUE CROSS/BLUE SHIELD | Primary: Family Medicine

## 2022-02-25 ENCOUNTER — Inpatient Hospital Stay: Payer: BLUE CROSS/BLUE SHIELD | Primary: Family Medicine

## 2022-02-25 DIAGNOSIS — Z125 Encounter for screening for malignant neoplasm of prostate: Secondary | ICD-10-CM

## 2022-02-25 DIAGNOSIS — M25562 Pain in left knee: Secondary | ICD-10-CM

## 2022-02-25 LAB — LIPID PANEL
Cholesterol, Total: 170 mg/dL (ref 0–199)
HDL: 36 mg/dL (ref >40)
LDL Calculated: 107 mg/dL — ABNORMAL HIGH (ref 0–99)
Triglycerides: 133 mg/dL (ref 0–149)
VLDL Cholesterol Calculated: 27 mg/dL

## 2022-02-25 LAB — COMPREHENSIVE METABOLIC PANEL
ALT: 29 U/L (ref 0–40)
AST: 17 U/L (ref 0–39)
Albumin: 4 g/dL (ref 3.5–5.2)
Alkaline Phosphatase: 74 U/L (ref 40–129)
Anion Gap: 9 mmol/L (ref 7–16)
BUN: 10 mg/dL (ref 6–20)
CO2: 26 mmol/L (ref 22–29)
Calcium: 9.4 mg/dL (ref 8.6–10.2)
Chloride: 103 mmol/L (ref 98–107)
Creatinine: 0.8 mg/dL (ref 0.7–1.2)
Est, Glom Filt Rate: >60 mL/min/{1.73_m2} (ref >=60)
Glucose: 118 mg/dL — ABNORMAL HIGH (ref 74–99)
Potassium: 4.2 mmol/L (ref 3.5–5.0)
Sodium: 138 mmol/L (ref 132–146)
Total Bilirubin: 0.3 mg/dL (ref 0.0–1.2)
Total Protein: 7 g/dL (ref 6.4–8.3)

## 2022-02-25 LAB — CBC WITH AUTO DIFFERENTIAL
Basophils %: 0.6 % (ref 0.0–2.0)
Basophils Absolute: 0.05 E9/L (ref 0.00–0.20)
Eosinophils %: 1.2 % (ref 0.0–6.0)
Eosinophils Absolute: 0.1 E9/L (ref 0.05–0.50)
Hematocrit: 50 % (ref 37.0–54.0)
Hemoglobin: 16.3 g/dL (ref 12.5–16.5)
Immature Granulocytes #: 0.05 E9/L
Immature Granulocytes %: 0.6 % (ref 0.0–5.0)
Lymphocytes %: 20.2 % (ref 20.0–42.0)
Lymphocytes Absolute: 1.73 E9/L (ref 1.50–4.00)
MCH: 27.8 pg (ref 26.0–35.0)
MCHC: 32.6 % (ref 32.0–34.5)
MCV: 85.3 fL (ref 80.0–99.9)
MPV: 9.7 fL (ref 7.0–12.0)
Monocytes %: 8.4 % (ref 2.0–12.0)
Monocytes Absolute: 0.72 E9/L (ref 0.10–0.95)
Neutrophils %: 69 % (ref 43.0–80.0)
Neutrophils Absolute: 5.91 E9/L (ref 1.80–7.30)
Platelets: 274 E9/L (ref 130–450)
RBC: 5.86 E12/L — ABNORMAL HIGH (ref 3.80–5.80)
RDW: 13.5 fL (ref 11.5–15.0)
WBC: 8.6 E9/L (ref 4.5–11.5)

## 2022-02-25 LAB — TSH: TSH: 1.1 u[IU]/mL (ref 0.270–4.200)

## 2022-02-26 LAB — PSA SCREENING: PSA: 0.83 ng/mL (ref 0.00–4.00)

## 2022-02-26 LAB — HEMOGLOBIN A1C: Hemoglobin A1C: 6.1 % — ABNORMAL HIGH (ref 4.0–5.6)

## 2022-02-26 NOTE — Progress Notes (Signed)
The 10-year ASCVD risk score (Arnett DK, et al., 2019) is: 6.6%    Values used to calculate the score:      Age: 51 years      Sex: Male      Is Non-Hispanic African American: No      Diabetic: No      Tobacco smoker: No      Systolic Blood Pressure: 142 mmHg      Is BP treated: Yes      HDL Cholesterol: 36 mg/dL      Total Cholesterol: 170 mg/dL

## 2022-03-10 ENCOUNTER — Ambulatory Visit
Admit: 2022-03-10 | Discharge: 2022-03-10 | Payer: BLUE CROSS/BLUE SHIELD | Attending: Family Medicine | Primary: Family Medicine

## 2022-03-10 DIAGNOSIS — I4891 Unspecified atrial fibrillation: Secondary | ICD-10-CM

## 2022-03-10 MED ORDER — NITROGLYCERIN 0.4 MG SL SUBL
0.4 MG | ORAL_TABLET | SUBLINGUAL | 3 refills | Status: AC | PRN
Start: 2022-03-10 — End: 2022-04-17

## 2022-03-10 MED ORDER — APIXABAN 5 MG PO TABS
5 MG | ORAL_TABLET | Freq: Two times a day (BID) | ORAL | 3 refills | Status: AC
Start: 2022-03-10 — End: 2022-08-11

## 2022-03-10 MED ORDER — METOPROLOL SUCCINATE ER 25 MG PO TB24
25 MG | ORAL_TABLET | Freq: Every day | ORAL | 1 refills | Status: DC
Start: 2022-03-10 — End: 2022-06-30

## 2022-03-10 NOTE — Progress Notes (Signed)
SUBJECTIVE  Jay Maxwell is a 51 y.o. male.    HPI/Chief C/O:  Chief Complaint   Patient presents with    Hypertension     Pt here for 2 week follow up     Discuss Labs     Pt here to discuss labs and xray 02/25/2022     No Known Allergies  The patient is here for a medication list and treatment planning review  We will go over our care planning goals as well as take care of all refills  We will set up labs as well        Hypertension  This is a chronic problem. The current episode started more than 1 year ago. The problem is controlled. Associated symptoms include malaise/fatigue. Pertinent negatives include no anxiety, blurred vision, chest pain, headaches, neck pain, orthopnea, palpitations, peripheral edema, PND, shortness of breath or sweats. Risk factors for coronary artery disease include obesity, male gender and stress. Past treatments include calcium channel blockers and beta blockers. Compliance problems include diet and exercise.  There is no history of angina, kidney disease, CAD/MI (atrial fibrillation), CVA, heart failure, left ventricular hypertrophy, PVD or retinopathy. There is no history of chronic renal disease, coarctation of the aorta, hyperaldosteronism, hypercortisolism, hyperparathyroidism, a hypertension causing med, pheochromocytoma, renovascular disease, sleep apnea or a thyroid problem.     ROS:  Review of Systems   Constitutional:  Positive for malaise/fatigue. Negative for activity change, appetite change, chills, diaphoresis, fatigue, fever and unexpected weight change.   HENT: Negative.  Negative for congestion, dental problem, drooling, ear discharge, ear pain, facial swelling, hearing loss, mouth sores, nosebleeds, postnasal drip, rhinorrhea, sinus pressure, sinus pain, sneezing, sore throat, tinnitus, trouble swallowing and voice change.    Eyes:  Negative for blurred vision, photophobia, pain, discharge, redness, itching and visual disturbance.   Respiratory: Negative.  Negative  for apnea, cough, choking, chest tightness, shortness of breath, wheezing and stridor.    Cardiovascular: Negative.  Negative for chest pain, palpitations, orthopnea, leg swelling and PND.   Gastrointestinal: Negative.  Negative for abdominal distention, abdominal pain, anal bleeding, blood in stool, constipation, diarrhea, nausea, rectal pain and vomiting.   Endocrine: Negative.  Negative for cold intolerance, heat intolerance, polydipsia, polyphagia and polyuria.   Genitourinary: Negative.  Negative for decreased urine volume, difficulty urinating, dysuria, enuresis, flank pain, frequency, genital sores, hematuria, penile discharge, penile pain, penile swelling, scrotal swelling, testicular pain and urgency.   Musculoskeletal:  Positive for arthralgias (left knee pain). Negative for back pain, gait problem, joint swelling, myalgias, neck pain and neck stiffness.   Skin: Negative.  Negative for color change, pallor, rash and wound.   Allergic/Immunologic: Negative.  Negative for environmental allergies, food allergies and immunocompromised state.   Neurological: Negative.  Negative for dizziness, tremors, seizures, syncope, facial asymmetry, speech difficulty, weakness, light-headedness, numbness and headaches.   Hematological: Negative.  Negative for adenopathy. Does not bruise/bleed easily.   Psychiatric/Behavioral: Negative.  Negative for agitation, behavioral problems, confusion, decreased concentration, dysphoric mood, hallucinations, self-injury, sleep disturbance and suicidal ideas. The patient is not nervous/anxious and is not hyperactive.       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    Diverticulitis     Hernia     Mixed hyperlipidemia 11/17/2016    Restless leg      Past Surgical History:   Procedure Laterality Date  ABDOMEN SURGERY      Bowel removal    APPENDECTOMY      HERNIA REPAIR  2008    OTHER SURGICAL HISTORY   09/19/2014    laparoscopic incisional hernia repair with mesh    TONSILLECTOMY         Past Family Hx:  Reviewed with patient      Problem Relation Age of Onset    Heart Disease Mother     High Blood Pressure Mother     Diabetes Mother     High Blood Pressure Father     Diabetes Father     Heart Disease Father        Social Hx:  Reviewed with patient  Social History     Tobacco Use    Smoking status: Never    Smokeless tobacco: Never   Substance Use Topics    Alcohol use: No     Comment: social       OBJECTIVE  BP 130/88   Pulse 74   Temp 97.6 F (36.4 C)   Resp 18   Ht 6' (1.829 m)   Wt 288 lb (130.6 kg)   SpO2 97%   BMI 39.06 kg/m     Problem List:  Jay FallenKenneth does not have any pertinent problems on file.    PHYS EX:  Physical Exam  Vitals and nursing note reviewed.   Constitutional:       General: He is not in acute distress.     Appearance: Normal appearance. He is well-developed. He is obese. He is not ill-appearing, toxic-appearing or diaphoretic.      Comments: Patient has morbid obesity. Patient instructed on low calorie, healthy diet.        HENT:      Head: Normocephalic and atraumatic.      Right Ear: External ear normal.      Left Ear: External ear normal.      Nose: No congestion or rhinorrhea.      Mouth/Throat:      Mouth: Mucous membranes are moist.      Pharynx: Oropharynx is clear. No oropharyngeal exudate or posterior oropharyngeal erythema.   Eyes:      General: No scleral icterus.        Right eye: No discharge.         Left eye: No discharge.      Conjunctiva/sclera: Conjunctivae normal.      Pupils: Pupils are equal, round, and reactive to light.   Neck:      Thyroid: No thyromegaly.      Vascular: No carotid bruit or JVD.      Trachea: No tracheal deviation.   Cardiovascular:      Rate and Rhythm: Tachycardia present. Rhythm irregularly irregular.      Pulses: Normal pulses.      Heart sounds: Normal heart sounds. Heart sounds not distant. No murmur heard.  No systolic murmur is  present.   No diastolic murmur is present.     No friction rub. No gallop.   Pulmonary:      Effort: Pulmonary effort is normal. No respiratory distress.      Breath sounds: Normal breath sounds. No stridor. No wheezing, rhonchi or rales.   Chest:      Chest wall: No tenderness.   Abdominal:      General: Bowel sounds are normal. There is no distension.      Palpations: Abdomen is soft. There is no mass.  Tenderness: There is no abdominal tenderness. There is no right CVA tenderness, left CVA tenderness, guarding or rebound.      Hernia: No hernia is present.   Musculoskeletal:         General: Tenderness (left knee pain) present. No swelling, deformity or signs of injury. Normal range of motion.      Cervical back: Normal range of motion and neck supple. No rigidity. No muscular tenderness.      Right lower leg: No edema.      Left lower leg: No edema.   Lymphadenopathy:      Cervical: No cervical adenopathy.   Skin:     General: Skin is warm.      Coloration: Skin is not jaundiced or pale.      Findings: No bruising, erythema, lesion or rash.   Neurological:      General: No focal deficit present.      Mental Status: He is alert and oriented to person, place, and time.      Cranial Nerves: No cranial nerve deficit.      Sensory: No sensory deficit.      Motor: No weakness or abnormal muscle tone.      Coordination: Coordination normal.      Gait: Gait normal.      Deep Tendon Reflexes: Reflexes are normal and symmetric. Reflexes normal.       The 10-year ASCVD risk score (Arnett DK, et al., 2019) is: 5.7%    Values used to calculate the score:      Age: 14 years      Sex: Male      Is Non-Hispanic African American: No      Diabetic: No      Tobacco smoker: No      Systolic Blood Pressure: 130 mmHg      Is BP treated: Yes      HDL Cholesterol: 36 mg/dL      Total Cholesterol: 170 mg/dL     ASSESSMENT/PLAN  Ceejay was seen today for hypertension and discuss labs.    Diagnoses and all orders for this  visit:    New onset atrial fibrillation [facility])  Long talk on treatment and prevention  Literature is given   --I will add Eiquis and a BB today   -     apixaban (ELIQUIS) 5 MG TABS tablet; Take 1 tablet by mouth 2 times daily  -     metoprolol succinate (TOPROL XL) 25 MG extended release tablet; Take 1 tablet by mouth daily  -     [place] - Beverely Risen, MD, Cardiology, [place] [facility]    Irregular heart beat  -     EKG 12 lead; Future  -     EKG 12 lead    PAF (paroxysmal atrial fibrillation) (HCC)    ---VASCULAR PANEL  A) asa, plavix, aggrenox  B) ELIQUIS, pletal, tzd, statin  C) ace, hctz, folic, CCB  D) cannikinumab, fish oils     ---CARDIAC---ELIQUIS, ace, BETA, statin, hctz, ( CCB )     Mixed hyperlipidemia  --Mediterranean diet, exercise, weight loss, vitamins    We have a long talk on cholesterol and importance of lowering it       Other orders  -     nitroGLYCERIN (NITROSTAT) 0.4 MG SL tablet; Place 1 tablet under the tongue every 5 minutes as needed for Chest pain up to max of 3 total doses. If no relief after 1 dose, call 911.  Outpatient Encounter Medications as of 03/10/2022   Medication Sig Dispense Refill    apixaban (ELIQUIS) 5 MG TABS tablet Take 1 tablet by mouth 2 times daily 60 tablet 3    metoprolol succinate (TOPROL XL) 25 MG extended release tablet Take 1 tablet by mouth daily 90 tablet 1    nitroGLYCERIN (NITROSTAT) 0.4 MG SL tablet Place 1 tablet under the tongue every 5 minutes as needed for Chest pain up to max of 3 total doses. If no relief after 1 dose, call 911. 25 tablet 3    amLODIPine (NORVASC) 5 MG tablet Take 1 tablet by mouth daily 90 tablet 1    ibuprofen (ADVIL;MOTRIN) 600 MG tablet Take 1 tablet by mouth 3 times daily as needed for Pain 90 tablet 5    acetaminophen (AMINOFEN) 325 MG tablet Take TWO with Ibuprofen every 8 hours as needed 120 tablet 3    vardenafil (LEVITRA) 20 MG tablet TAKE 1 TABLET BY MOUTH AS NEEDED FOR ERECTILE DYSFUNCTION 5 tablet 3     No  facility-administered encounter medications on file as of 03/10/2022.       Return in about 1 week (around 03/17/2022).        Reviewed recent labs related to [facility] current problems      Discussed importance of regular Health Maintenance follow up  Health Maintenance   Topic    HIV screen     Hepatitis C screen     Colorectal Cancer Screen     Shingles vaccine (1 of 2)    COVID-19 Vaccine (3 - Booster for [employer] series)    Flu vaccine (Season Ended)    Depression Screen     A1C test (Diabetic or Prediabetic)     DTaP/Tdap/Td vaccine (2 - Td or Tdap)    Lipids     Hepatitis A vaccine     Hib vaccine     Meningococcal (ACWY) vaccine     Pneumococcal 0-64 years Vaccine     Diabetes screen

## 2022-03-18 ENCOUNTER — Ambulatory Visit
Admit: 2022-03-18 | Discharge: 2022-03-18 | Payer: BLUE CROSS/BLUE SHIELD | Attending: Family Medicine | Primary: Family Medicine

## 2022-03-18 DIAGNOSIS — I480 Paroxysmal atrial fibrillation: Secondary | ICD-10-CM

## 2022-03-18 NOTE — Telephone Encounter (Signed)
NP calling to schedule with Cardiology - referral in the chart.    Patient Appointment Form:      PCP: Dr. Donnetta Simpers  Referring: Dr. Donnetta Simpers    Has the Patient:    Seen a Cardiologist? no    Had a heart catheterization? no    Had heart surgery? no    Had a stress test or nuclear stress test? no    Had an echocardiogram? no    Had a vascular ultrasound? no    Had a 24/48 heart monitor or extended cardiac event monitor? no    Had recent blood work in the last 6 months? Yes 02/25/22 Epic PCP     Had a pacemaker/ICD/ILR implant? no    Seen an Electrophysiologist? no        Will send records via: No prior Cardiology hx or recs - PCP recs in Epic       Date & time of appointment:  04/17/22 @ 12:40 with Dr. [name] - Jay Maxwell

## 2022-03-18 NOTE — Progress Notes (Signed)
SUBJECTIVE  Jay Maxwell is a 51 y.o. male.    HPI/Chief C/O:  Chief Complaint   Patient presents with    Atrial Fibrillation     Pt here for follow up on new A-Fib     No Known Allergies  The patient is here for a medication list and treatment planning review  We will go over our care planning goals as well as take care of all refills  We will set up labs as well        Hypertension  This is a chronic problem. The current episode started more than 1 year ago. The problem is controlled. Associated symptoms include malaise/fatigue. Pertinent negatives include no anxiety, blurred vision, chest pain, headaches, neck pain, orthopnea, palpitations, peripheral edema, PND, shortness of breath or sweats. Risk factors for coronary artery disease include obesity, male gender and stress. Past treatments include calcium channel blockers and beta blockers. Compliance problems include diet and exercise.  There is no history of angina, kidney disease, CAD/MI (atrial fibrillation), CVA, heart failure, left ventricular hypertrophy, PVD or retinopathy. There is no history of chronic renal disease, coarctation of the aorta, hyperaldosteronism, hypercortisolism, hyperparathyroidism, a hypertension causing med, pheochromocytoma, renovascular disease, sleep apnea or a thyroid problem.     ROS:  Review of Systems   Constitutional:  Positive for malaise/fatigue. Negative for activity change, appetite change, chills, diaphoresis, fatigue, fever and unexpected weight change.   HENT: Negative.  Negative for congestion, dental problem, drooling, ear discharge, ear pain, facial swelling, hearing loss, mouth sores, nosebleeds, postnasal drip, rhinorrhea, sinus pressure, sinus pain, sneezing, sore throat, tinnitus, trouble swallowing and voice change.    Eyes:  Negative for blurred vision, photophobia, pain, discharge, redness, itching and visual disturbance.   Respiratory: Negative.  Negative for apnea, cough, choking, chest tightness,  shortness of breath, wheezing and stridor.    Cardiovascular: Negative.  Negative for chest pain, palpitations, orthopnea, leg swelling and PND.   Gastrointestinal: Negative.  Negative for abdominal distention, abdominal pain, anal bleeding, blood in stool, constipation, diarrhea, nausea, rectal pain and vomiting.   Endocrine: Negative.  Negative for cold intolerance, heat intolerance, polydipsia, polyphagia and polyuria.   Genitourinary: Negative.  Negative for decreased urine volume, difficulty urinating, dysuria, enuresis, flank pain, frequency, genital sores, hematuria, penile discharge, penile pain, penile swelling, scrotal swelling, testicular pain and urgency.   Musculoskeletal:  Positive for arthralgias (left knee pain). Negative for back pain, gait problem, joint swelling, myalgias, neck pain and neck stiffness.   Skin: Negative.  Negative for color change, pallor, rash and wound.   Allergic/Immunologic: Negative.  Negative for environmental allergies, food allergies and immunocompromised state.   Neurological: Negative.  Negative for dizziness, tremors, seizures, syncope, facial asymmetry, speech difficulty, weakness, light-headedness, numbness and headaches.   Hematological: Negative.  Negative for adenopathy. Does not bruise/bleed easily.   Psychiatric/Behavioral:  Positive for sleep disturbance. Negative for agitation, behavioral problems, confusion, decreased concentration, dysphoric mood, hallucinations, self-injury and suicidal ideas. The patient is not nervous/anxious and is not hyperactive.       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    Diverticulitis     Hernia     Mixed hyperlipidemia 11/17/2016    Primary hypertension 03/18/2022    Restless leg      Past Surgical History:   Procedure Laterality Date    ABDOMEN SURGERY      Bowel removal  APPENDECTOMY      HERNIA REPAIR  2008    OTHER SURGICAL HISTORY   09/19/2014    laparoscopic incisional hernia repair with mesh    TONSILLECTOMY         Past Family Hx:  Reviewed with patient      Problem Relation Age of Onset    Heart Disease Mother     High Blood Pressure Mother     Diabetes Mother     High Blood Pressure Father     Diabetes Father     Heart Disease Father        Social Hx:  Reviewed with patient  Social History     Tobacco Use    Smoking status: Never    Smokeless tobacco: Never   Substance Use Topics    Alcohol use: No     Comment: social       OBJECTIVE  BP 126/82   Pulse 80   Temp 97.4 F (36.3 C)   Resp 18   Ht 6' (1.829 m)   Wt 289 lb (131.1 kg)   SpO2 98%   BMI 39.20 kg/m     Problem List:  Jay Maxwell does not have any pertinent problems on file.    PHYS EX:  Physical Exam  Vitals and nursing note reviewed.   Constitutional:       General: He is not in acute distress.     Appearance: Normal appearance. He is well-developed. He is obese. He is not ill-appearing, toxic-appearing or diaphoretic.      Comments: Patient has morbid obesity. Patient instructed on low calorie, healthy diet.        HENT:      Head: Normocephalic and atraumatic.      Right Ear: External ear normal.      Left Ear: External ear normal.      Nose: No congestion or rhinorrhea.      Mouth/Throat:      Mouth: Mucous membranes are moist.      Pharynx: Oropharynx is clear. No oropharyngeal exudate or posterior oropharyngeal erythema.   Eyes:      General: No scleral icterus.        Right eye: No discharge.         Left eye: No discharge.      Conjunctiva/sclera: Conjunctivae normal.      Pupils: Pupils are equal, round, and reactive to light.   Neck:      Thyroid: No thyromegaly.      Vascular: No carotid bruit or JVD.      Trachea: No tracheal deviation.   Cardiovascular:      Rate and Rhythm: Normal rate and regular rhythm. No extrasystoles are present.     Pulses: Normal pulses.      Heart sounds: Normal heart sounds. Heart sounds not distant. No murmur heard.  No systolic murmur  is present.   No diastolic murmur is present.     No friction rub. No gallop.   Pulmonary:      Effort: Pulmonary effort is normal. No respiratory distress.      Breath sounds: Normal breath sounds. No stridor. No wheezing, rhonchi or rales.   Chest:      Chest wall: No tenderness.   Abdominal:      General: Bowel sounds are normal. There is no distension.      Palpations: Abdomen is soft. There is no mass.      Tenderness: There is no abdominal tenderness. There  is no right CVA tenderness, left CVA tenderness, guarding or rebound.      Hernia: No hernia is present.   Musculoskeletal:         General: Tenderness (left knee pain) present. No swelling, deformity or signs of injury. Normal range of motion.      Cervical back: Normal range of motion and neck supple. No rigidity. No muscular tenderness.      Right lower leg: No edema.      Left lower leg: No edema.   Lymphadenopathy:      Cervical: No cervical adenopathy.   Skin:     General: Skin is warm.      Coloration: Skin is not jaundiced or pale.      Findings: No bruising, erythema, lesion or rash.   Neurological:      General: No focal deficit present.      Mental Status: He is alert and oriented to person, place, and time.      Cranial Nerves: No cranial nerve deficit.      Sensory: No sensory deficit.      Motor: No weakness or abnormal muscle tone.      Coordination: Coordination normal.      Gait: Gait normal.      Deep Tendon Reflexes: Reflexes are normal and symmetric. Reflexes normal.       The 10-year ASCVD risk score (Arnett DK, et al., 2019) is: 5.4%    Values used to calculate the score:      Age: 89 years      Sex: Male      Is Non-Hispanic African American: No      Diabetic: No      Tobacco smoker: No      Systolic Blood Pressure: 126 mmHg      Is BP treated: Yes      HDL Cholesterol: 36 mg/dL      Total Cholesterol: 170 mg/dL     ASSESSMENT/PLAN  [name] was seen today for atrial fibrillation.    Diagnoses and all orders for this visit:    PAF  (paroxysmal atrial fibrillation) (HCC)  --stable on current care planning  -- continue treatment as we are meeting goals   --converted to normal sinus     ---VASCULAR PANEL  A) asa, plavix, aggrenox  B) COUMADIN, pletal, tzd, statin  C) ace, hctz, folic, CCB  D) cannikinumab, fish oils     ---CARDIAC---COUMADIN, ace, BETA, statin, hctz, ( CCB )     Mixed hyperlipidemia  --Mediterranean diet, exercise, weight loss, vitamins    We have a long talk on cholesterol and importance of lowering it       Primary hypertension ---controlled   --patient is instructed on low to moderate sodium ( 2 to 2.5 grams ), daily    Also to increase potassium in the diet to about 3.5 grams daily    Literature is provided           Outpatient Encounter Medications as of 03/18/2022   Medication Sig Dispense Refill    apixaban (ELIQUIS) 5 MG TABS tablet Take 1 tablet by mouth 2 times daily 60 tablet 3    metoprolol succinate (TOPROL XL) 25 MG extended release tablet Take 1 tablet by mouth daily 90 tablet 1    nitroGLYCERIN (NITROSTAT) 0.4 MG SL tablet Place 1 tablet under the tongue every 5 minutes as needed for Chest pain up to max of 3 total doses. If no relief after 1 dose, call  [phone]. 25 tablet 3    amLODIPine (NORVASC) 5 MG tablet Take 1 tablet by mouth daily 90 tablet 1    ibuprofen (ADVIL;MOTRIN) 600 MG tablet Take 1 tablet by mouth 3 times daily as needed for Pain 90 tablet 5    acetaminophen (AMINOFEN) 325 MG tablet Take TWO with Ibuprofen every 8 hours as needed 120 tablet 3    vardenafil (LEVITRA) 20 MG tablet TAKE 1 TABLET BY MOUTH AS NEEDED FOR ERECTILE DYSFUNCTION 5 tablet 3     No facility-administered encounter medications on file as of 03/18/2022.       No follow-ups on file.        Reviewed recent labs related to [facility] current problems      Discussed importance of regular Health Maintenance follow up  Health Maintenance   Topic    HIV screen     Hepatitis C screen     Colorectal Cancer Screen     Shingles vaccine (1 of 2)     COVID-19 Vaccine (3 - Booster for [employer] series)    Flu vaccine (Season Ended)    Depression Screen     A1C test (Diabetic or Prediabetic)     DTaP/Tdap/Td vaccine (2 - Td or Tdap)    Lipids     Hepatitis A vaccine     Hib vaccine     Meningococcal (ACWY) vaccine     Pneumococcal 0-64 years Vaccine     Diabetes screen

## 2022-03-20 LAB — FECAL DNA COLORECTAL CANCER SCREENING (COLOGUARD): FIT-DNA (Cologuard): NEGATIVE

## 2022-04-17 ENCOUNTER — Encounter

## 2022-04-17 ENCOUNTER — Ambulatory Visit
Admit: 2022-04-17 | Discharge: 2022-04-17 | Payer: BLUE CROSS/BLUE SHIELD | Attending: Cardiovascular Disease | Primary: Family Medicine

## 2022-04-17 DIAGNOSIS — I4891 Unspecified atrial fibrillation: Secondary | ICD-10-CM

## 2022-04-17 NOTE — Progress Notes (Signed)
Chief Complaint   Patient presents with    Atrial Fibrillation       Patient Active Problem List    Diagnosis Date Noted    Atrial fibrillation (HCC) 04/17/2022     Overview Note:     A. CHADSVASC = 1        Primary hypertension 03/18/2022    Mixed hyperlipidemia 11/17/2016    Restless leg        Current Outpatient Medications   Medication Sig Dispense Refill    apixaban (ELIQUIS) 5 MG TABS tablet Take 1 tablet by mouth 2 times daily 60 tablet 3    metoprolol succinate (TOPROL XL) 25 MG extended release tablet Take 1 tablet by mouth daily 90 tablet 1    amLODIPine (NORVASC) 5 MG tablet Take 1 tablet by mouth daily 90 tablet 1    ibuprofen (ADVIL;MOTRIN) 600 MG tablet Take 1 tablet by mouth 3 times daily as needed for Pain 90 tablet 5    acetaminophen (AMINOFEN) 325 MG tablet Take TWO with Ibuprofen every 8 hours as needed 120 tablet 3    vardenafil (LEVITRA) 20 MG tablet TAKE 1 TABLET BY MOUTH AS NEEDED FOR ERECTILE DYSFUNCTION 5 tablet 3     No current facility-administered medications for this visit.        No Known Allergies    Vitals:    04/17/22 1232   BP: 118/82   Pulse: (!) 105   Resp: 16   Weight: 280 lb (127 kg)   Height: 6' (1.829 m)                 SUBJECTIVE: Jay Maxwell presents to the office today for consult - dr Donnetta Simpers.     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          Physical Exam   BP 118/82   Pulse (!) 105   Resp 16   Ht 6' (1.829 m)   Wt 280 lb (127 kg)   BMI 37.97 kg/m   Constitutional: Oriented to person, place, and time. Obese No distress.    Head: Normocephalic and atraumatic.   Neck: No  JVD present. Carotid bruit is not present.   Cardiovascular: Normal rate, irregular rhythm, normal heart sounds and intact distal pulses.  No gallop and no friction rub.  No murmur heard.  Pulmonary/Chest: Effort normal and breath sounds normal.   Abdominal: Soft. Bowel sounds are normal. No distension and no mass.   Musculoskeletal: No edema   Neurological: Alert and oriented to person, place, and time.   Skin: Skin is warm and dry. No rash noted.  Psychiatric: Normal mood and affect. Behavior is normal.     EKG:  atrial fibrillation, rate controlled.    ASSESSMENT AND PLAN:  Patient Active Problem List   Diagnosis    Restless leg    Mixed hyperlipidemia    Primary hypertension    Atrial fibrillation (HCC)     Persistent atrial fibrillation, asymptomatic, likely non valvular, CHADSVASC = 1, compliant with NOAC for a month  Education on importance of well controlled HTN (goal BP < 130/80),  adequate weight control (goal BMI of < 27), physical activity consisting of moderate cardiopulmonary exercise up to a goal of 250 min/wk, daily compliance with CPAP in treating sleep apnea, smoking cessation and limited ETOH intake.   Discussion of atrial fibrillation, including symptoms, thromboembolism potential, rate vs rhythm control, bleeding risk with OAC,  and DCCV procedure        [name], M.D  [facility]

## 2022-04-22 ENCOUNTER — Inpatient Hospital Stay: Admit: 2022-04-22 | Discharge: 2022-04-22 | Payer: BLUE CROSS/BLUE SHIELD | Primary: Family Medicine

## 2022-04-22 DIAGNOSIS — I4819 Other persistent atrial fibrillation: Secondary | ICD-10-CM

## 2022-04-22 LAB — EKG 12-LEAD
Atrial Rate: 214 {beats}/min
Atrial Rate: 77 {beats}/min
P Axis: 42 degrees
P-R Interval: 178 ms
Q-T Interval: 364 ms
Q-T Interval: 400 ms
QRS Duration: 82 ms
QRS Duration: 84 ms
QTc Calculation (Bazett): 452 ms
QTc Calculation (Bazett): 469 ms
R Axis: -5 degrees
R Axis: 0 degrees
T Axis: -9 degrees
T Axis: 1 degrees
Ventricular Rate: 100 {beats}/min
Ventricular Rate: 77 {beats}/min

## 2022-04-22 MED ORDER — LACTATED RINGERS IV SOLN
INTRAVENOUS | Status: AC
Start: 2022-04-22 — End: 2022-04-23
  Administered 2022-04-22: 16:00:00 via INTRAVENOUS

## 2022-04-22 MED ORDER — PROPOFOL 200 MG/20ML IV EMUL
200 MG/20ML | INTRAVENOUS | Status: DC | PRN
Start: 2022-04-22 — End: 2022-04-22
  Administered 2022-04-22: 16:00:00 220 via INTRAVENOUS

## 2022-04-22 MED ORDER — SILVER SULFADIAZINE 1 % EX CREA
1 | Freq: Every day | CUTANEOUS | Status: DC
Start: 2022-04-22 — End: 2022-04-23

## 2022-04-22 MED ORDER — PROPOFOL 200 MG/20ML IV EMUL
200 MG/20ML | INTRAVENOUS | Status: AC
Start: 2022-04-22 — End: ?

## 2022-04-22 MED FILL — DIPRIVAN 200 MG/20ML IV EMUL: 200 MG/20ML | INTRAVENOUS | Qty: 40

## 2022-04-22 MED FILL — SILVADENE 1 % EX CREA: 1 % | CUTANEOUS | Qty: 20

## 2022-04-22 NOTE — Anesthesia Pre-Procedure Evaluation (Addendum)
Department of Anesthesiology  Preprocedure Note       Name:  Jay Maxwell   Age:  51 y.o.  DOB:  30-Jun-1971                                          MRN:  [id number]         Date:  04/22/2022      Surgeon: [facility]    Procedure: DCCV    Medications prior to admission:   Prior to Admission medications    Medication Sig Start Date End Date Taking? Authorizing Provider   apixaban (ELIQUIS) 5 MG TABS tablet Take 1 tablet by mouth 2 times daily 03/10/22   Richard Truman Hayward Catterlin, DO   metoprolol succinate (TOPROL XL) 25 MG extended release tablet Take 1 tablet by mouth daily 03/10/22   Drinda Butts Catterlin, DO   amLODIPine (NORVASC) 5 MG tablet Take 1 tablet by mouth daily 02/24/22   Drinda Butts Catterlin, DO   ibuprofen (ADVIL;MOTRIN) 600 MG tablet Take 1 tablet by mouth 3 times daily as needed for Pain 02/24/22   Drinda Butts Catterlin, DO   acetaminophen (AMINOFEN) 325 MG tablet Take TWO with Ibuprofen every 8 hours as needed 02/24/22   Drinda Butts Catterlin, DO   vardenafil (LEVITRA) 20 MG tablet TAKE 1 TABLET BY MOUTH AS NEEDED FOR ERECTILE DYSFUNCTION 07/12/19   Richard Lovie Macadamia, DO       Current medications:    Current Outpatient Medications   Medication Sig Dispense Refill   . apixaban (ELIQUIS) 5 MG TABS tablet Take 1 tablet by mouth 2 times daily 60 tablet 3   . metoprolol succinate (TOPROL XL) 25 MG extended release tablet Take 1 tablet by mouth daily 90 tablet 1   . amLODIPine (NORVASC) 5 MG tablet Take 1 tablet by mouth daily 90 tablet 1   . ibuprofen (ADVIL;MOTRIN) 600 MG tablet Take 1 tablet by mouth 3 times daily as needed for Pain 90 tablet 5   . acetaminophen (AMINOFEN) 325 MG tablet Take TWO with Ibuprofen every 8 hours as needed 120 tablet 3   . vardenafil (LEVITRA) 20 MG tablet TAKE 1 TABLET BY MOUTH AS NEEDED FOR ERECTILE DYSFUNCTION 5 tablet 3     Current Facility-Administered Medications   Medication Dose Route Frequency Provider Last Rate Last Admin   . lactated ringers IV soln infusion   IntraVENous  Continuous Shelly Coss, MD           Allergies:  No Known Allergies    Problem List:    Patient Active Problem List   Diagnosis Code   . Restless leg G25.81   . Mixed hyperlipidemia E78.2   . Primary hypertension I10   . Atrial fibrillation ([place]) I48.91       Past Medical History:        Diagnosis Date   . 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 Diverticulitis    . Hernia    . Mixed hyperlipidemia 11/17/2016   . Primary hypertension 03/18/2022   . Restless leg        Past Surgical History:        Procedure Laterality Date   . ABDOMEN SURGERY      12 inches with colostomy with reversal   . APPENDECTOMY     . HERNIA REPAIR  2008   . OTHER SURGICAL HISTORY  09/19/2014    laparoscopic incisional hernia repair with mesh   . TONSILLECTOMY         Social History:    Social History     Tobacco Use   . Smoking status: Never   . Smokeless tobacco: Never   Substance Use Topics   . Alcohol use: No     Comment: social                                Counseling given: Not Answered      Vital Signs (Current):   Vitals:    04/21/22 1052 04/22/22 1038 04/22/22 1133   BP:  (!) 150/90 (!) 110/90   Pulse:  80 88   Resp:  18    Temp:  36.9 C (98.5 F) 36.4 C (97.5 F)   TempSrc:   Infrared   SpO2:  97% 98%   Weight: 280 lb (127 kg) 289 lb (131.1 kg)    Height: 6' (1.829 m) 6' (1.829 m)                                               BP Readings from Last 3 Encounters:   04/22/22 (!) 110/90   04/17/22 118/82   03/18/22 126/82       NPO Status: Time of last liquid consumption: 0700 (sip of water with meds )                        Time of last solid consumption: 2000                        Date of last liquid consumption: 04/22/22                        Date of last solid food consumption: 04/21/22    BMI:   Wt Readings from Last 3 Encounters:   04/22/22 289 lb (131.1 kg)   04/17/22 280 lb (127 kg)   03/18/22 289 lb (131.1 kg)     Body mass index is 39.2 kg/m.    CBC:   Lab Results   Component Value Date/Time    WBC 8.6  02/25/2022 01:48 PM    RBC 5.86 02/25/2022 01:48 PM    HGB 16.3 02/25/2022 01:48 PM    HCT 50.0 02/25/2022 01:48 PM    MCV 85.3 02/25/2022 01:48 PM    RDW 13.5 02/25/2022 01:48 PM    PLT 274 02/25/2022 01:48 PM       CMP:   Lab Results   Component Value Date/Time    NA 138 02/25/2022 01:48 PM    K 4.2 02/25/2022 01:48 PM    CL 103 02/25/2022 01:48 PM    CO2 26 02/25/2022 01:48 PM    BUN 10 02/25/2022 01:48 PM    CREATININE 0.8 02/25/2022 01:48 PM    GFRAA >60 07/26/2019 09:03 AM    LABGLOM >60 02/25/2022 01:48 PM    GLUCOSE 118 02/25/2022 01:48 PM    PROT 7.0 02/25/2022 01:48 PM    CALCIUM 9.4 02/25/2022 01:48 PM    BILITOT 0.3 02/25/2022 01:48 PM    ALKPHOS 74 02/25/2022 01:48 PM    AST 17 02/25/2022 01:48 PM  ALT 29 02/25/2022 01:48 PM       POC Tests: No results for input(s): POCGLU, POCNA, POCK, POCCL, POCBUN, POCHEMO, POCHCT in the last 72 hours.    Coags:   Lab Results   Component Value Date/Time    PROTIME 12.7 12/14/2015 10:30 AM    INR 1.1 12/14/2015 10:30 AM    APTT 29.0 12/14/2015 10:30 AM       HCG (If Applicable): No results found for: PREGTESTUR, PREGSERUM, HCG, HCGQUANT     ABGs: No results found for: PHART, PO2ART, PCO2ART, HCO3ART, BEART, O2SATART     Type & Screen (If Applicable):  No results found for: LABABO, LABRH    Drug/Infectious Status (If Applicable):  No results found for: HIV, HEPCAB    COVID-19 Screening (If Applicable): No results found for: COVID19        Anesthesia Evaluation  Patient summary reviewed no history of anesthetic complications:   Airway: Mallampati: II     Neck ROM: full  Mouth opening: > = 3 FB   Dental: normal exam         Pulmonary: breath sounds clear to auscultation      (-) shortness of breath, recent URI and not a current smoker          Patient did not smoke on day of surgery.                 Cardiovascular:    (+) hypertension:, dysrhythmias: atrial fibrillation, hyperlipidemia      ECG reviewed  Rhythm: irregular  Rate: normal           Beta Blocker:  Dose  within 24 Hrs      ROS comment: EKG 6.28.23  Atrial fibrillation  Abnormal ECG  When compared with ECG of 14-Dec-2015 09:57,  Atrial fibrillation has replaced Sinus rhythm     Neuro/Psych:   Negative Neuro/Psych ROS              GI/Hepatic/Renal:        (-) GERD       Endo/Other:    (+) blood dyscrasia (eliquis LD 6.28.23): anticoagulation therapy, arthritis:., no malignancy/cancer.    (-) no electrolyte abnormalities, no malignancy/cancer               Abdominal:   (+) obese,           Vascular: negative vascular ROS.         Other Findings:           Anesthesia Plan      MAC     ASA 3       Induction: intravenous.      Anesthetic plan and risks discussed with patient.        Attending anesthesiologist reviewed and agrees with Preprocedure content                Angelyn Punt, APRN - CRNA   04/22/2022

## 2022-04-22 NOTE — Anesthesia Post-Procedure Evaluation (Signed)
Department of Anesthesiology  Postprocedure Note    Patient: Jay Maxwell  MRN: [id number]  Birthdate: 1971/09/15  Date of evaluation: 04/22/2022      Procedure Summary     Date: 04/22/22 Room / Location: [facility]    Anesthesia Start: 1211 Anesthesia Stop: 2637    Procedure: CARDIOVERSION Diagnosis: History of cardioversion    Scheduled Providers:  Responsible Provider: Shelly Coss, MD    Anesthesia Type: MAC ASA Status: 3          Anesthesia Type: No value filed.    Aldrete Phase I: Aldrete Score: 10    Aldrete Phase II:        Anesthesia Post Evaluation    Patient location during evaluation: PACU  Patient participation: complete - patient participated  Level of consciousness: awake  Airway patency: patent  Nausea & Vomiting: no nausea and no vomiting  Complications: no  Cardiovascular status: hemodynamically stable and blood pressure returned to baseline  Respiratory status: acceptable  Hydration status: euvolemic

## 2022-04-22 NOTE — H&P (Signed)
No chief complaint on file.      Patient Active Problem List    Diagnosis Date Noted    Atrial fibrillation (HCC) 04/17/2022     Overview Note:     A. CHADSVASC = 1        Primary hypertension 03/18/2022    Mixed hyperlipidemia 11/17/2016    Restless leg        Current Outpatient Medications   Medication Sig Dispense Refill    apixaban (ELIQUIS) 5 MG TABS tablet Take 1 tablet by mouth 2 times daily 60 tablet 3    metoprolol succinate (TOPROL XL) 25 MG extended release tablet Take 1 tablet by mouth daily 90 tablet 1    amLODIPine (NORVASC) 5 MG tablet Take 1 tablet by mouth daily 90 tablet 1    ibuprofen (ADVIL;MOTRIN) 600 MG tablet Take 1 tablet by mouth 3 times daily as needed for Pain 90 tablet 5    acetaminophen (AMINOFEN) 325 MG tablet Take TWO with Ibuprofen every 8 hours as needed 120 tablet 3    vardenafil (LEVITRA) 20 MG tablet TAKE 1 TABLET BY MOUTH AS NEEDED FOR ERECTILE DYSFUNCTION 5 tablet 3     No current facility-administered medications for this encounter.        No Known Allergies    Vitals:    04/21/22 1052   Weight: 280 lb (127 kg)   Height: 6' (1.829 m)                 SUBJECTIVE: Jay Maxwell presents to the office today for consult - dr Donnetta Simpers.     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          Physical Exam   Ht 6' (1.829 m)   Wt 280 lb (127 kg)   BMI 37.97 kg/m   Constitutional: Oriented to person, place, and time. Obese No distress.    Head: Normocephalic and atraumatic.   Neck: No JVD present. Carotid bruit is not present.   Cardiovascular: Normal rate, irregular rhythm, normal heart sounds  and intact distal pulses.  No gallop and no friction rub.  No murmur heard.  Pulmonary/Chest: Effort normal and breath sounds normal.   Abdominal: Soft. Bowel sounds are normal. No distension and no mass.   Musculoskeletal: No edema   Neurological: Alert and oriented to person, place, and time.   Skin: Skin is warm and dry. No rash noted.  Psychiatric: Normal mood and affect. Behavior is normal.     EKG:  atrial fibrillation, rate controlled.    ASSESSMENT AND PLAN:  Patient Active Problem List   Diagnosis    Restless leg    Mixed hyperlipidemia    Primary hypertension    Atrial fibrillation (HCC)     Persistent atrial fibrillation, asymptomatic, likely non valvular, CHADSVASC = 1, compliant with NOAC for a month  Education on importance of well controlled HTN (goal BP < 130/80), adequate weight control (goal BMI of < 27), physical activity consisting of moderate cardiopulmonary exercise up to a goal of 250 min/wk, daily compliance with CPAP in treating sleep apnea, smoking cessation and limited  ETOH intake.   Discussion of atrial fibrillation, including symptoms, thromboembolism potential, rate vs rhythm control, bleeding risk with OAC,  and DCCV procedure    Jay Maxwell, M.D  [facility]    _____________________________________________________________________  - No new changes since evaluation by Dr. [name]  - Will proceed with cardioversion as scheduled    [name], MD

## 2022-04-22 NOTE — Progress Notes (Signed)
Pt given silvadene cream for chest and back, states does not want it on here due to clothing, will apply at home. Requesting discharge, met criteria. Instructions and follow up reviewed.

## 2022-04-22 NOTE — Discharge Instructions (Signed)
Electrical Cardioversion: What to Expect at Home  Your recovery     Electrical cardioversion is a treatment for an abnormal heartbeat, such as atrial fibrillation, supraventricular tachycardia, or ventricular tachycardia (VT). Your doctor used a brief electrical shock to reset your heart's rhythm.  After the procedure, you may have redness, like a sunburn, where the patches were. The medicines you got to make you sleepy may make you feel drowsy for the rest of the day. You may feel soreness or discomfort in your chest wall for a few days.  Your doctor may have you take medicines to help the heart beat normally and to prevent blood clots.  This care sheet gives you a general idea about how long it will take for you to recover. But each person recovers at a different pace. Follow the steps below to feel better as quickly as possible.  How can you care for yourself at home?  Medicines    If the doctor gave you a sedative:  For 24 hours, don't do anything that requires attention to detail. It takes time for the medicine's effects to completely wear off.  For your safety, do not drive or operate any machinery that could be dangerous. Wait until the medicine wears off and you can think clearly and react easily.     Be safe with medicines. Take your medicines exactly as prescribed. Call your doctor if you think you are having a problem with your medicine. You may take one or more of the following medicines:  Rate-control medicines to slow the heart rate.  Rhythm-control medicines that help the heart keep a normal rhythm.  Blood thinners, also called anticoagulants, which help prevent blood clots.  You will get more details on the specific medicines your doctor prescribes. Be sure you know how to take your medicines safely.     Do not take any vitamins, over-the-counter medicines, or herbal products without talking to your doctor first.   Exercise    Talk to your doctor about what type and level of exercise are safe  for you.     When you exercise, watch for signs that your heart is working too hard. You are pushing too hard if you cannot talk while you are exercising. If you become short of breath or dizzy or have chest pain, sit down and rest right away.     Check your pulse regularly. Place two fingers on the artery at the palm side of your wrist in line with your thumb. If your heartbeat seems uneven or fast, talk to your doctor.   Heart-healthy lifestyle    Do not smoke. If you need help quitting, talk to your doctor about stop-smoking programs and medicines. These can increase your chances of quitting for good.     Eat heart-healthy foods. Limit sodium, alcohol, and sugar.     Stay at a healthy weight. Lose weight if you need to.     Manage other health problems. If you think you may have a problem with alcohol or drug use, talk to your doctor.   Follow-up care is a key part of your treatment and safety. Be sure to make and go to all appointments, and call your doctor if you are having problems. It's also a good idea to know your test results and keep a list of the medicines you take.  When should you call for help?   Call 911 anytime you think you may need emergency care. For example, call if:      You passed out (lost consciousness).     You have symptoms of a heart attack. These may include:  Chest pain or pressure, or a strange feeling in the chest.  Sweating.  Shortness of breath.  Nausea or vomiting.  Pain, pressure, or a strange feeling in the back, neck, jaw, or upper belly or in one or both shoulders or arms.  Lightheadedness or sudden weakness.  A fast or irregular heartbeat.     After calling 911, the operator may tell you to chew 1 adult-strength or 2 to 4 low-dose aspirin. Wait for an ambulance. Do not try to drive yourself.     You have symptoms of a stroke. These may include:  Sudden numbness, tingling, weakness, or loss of movement in your face, arm, or leg, especially on only one side of your body.  Sudden  vision changes.  Sudden trouble speaking.  Sudden confusion or trouble understanding simple statements.  Sudden problems with walking or balance.  A sudden, severe headache that is different from past headaches.   Call your doctor now or seek immediate medical care if:    You feel dizzy or lightheaded, or you feel like you may faint.     You have a fast or irregular heartbeat.   Watch closely for any changes in your health, and be sure to contact your doctor if you have any problems.  Where can you learn more?  Go to [employer] and enter A617 to learn more about "Electrical Cardioversion: What to Expect at Home."  Current as of: July 02, 2021               Content Version: 13.7   2006-2023 [employer].   Care instructions adapted under license by [facility]. If you have questions about a medical condition or this instruction, always ask your healthcare professional. [employer] disclaims any warranty or liability for your use of this information.    [facility]  __________________________________________________    Discharge Instructions  Ambulatory Care Center    Patient Name:  Jay Maxwell      Date: 04/22/2022  Time:  1:33 PM      _x__ Rest today; all patients that received a general anesthetic should not drive for 24 hours.    ___ Keep dressing clean and dry. Suture removal will be arranged by your physician.    _x__ Drink extra fluids (non-alcoholic) for the next 24 hours.    _x__ Refer to your physician's special handout.    _x__ *Make an appointment to see your physician .     ___ Call office tomorrow to make follow-up arrangements.    ___ You need to have a responsible adult stay with you for the next 24 hours after surgery.    ___ Medications as follows:***    ___ Refer to the medication handout.    ___ Mady Haagensen / Shower Instructions    ___ Special Instructions specific to your care as follows:***        COMMENTS: Your recovery is  expected to be uneventful; however you may or may not experience mild to moderate discomfort after being discharged from the hospital. As instructed by your physician, if you begin to experience increasing pain, excessive swelling, bleeding from the area of you operation, saturation of the dressing, or any other unusual event as outlined by your physician, contact your doctor. If you are not able to reach your physician, the hospital Emergency Room is available 24 hours a  day.      I HAVE READ AND UNDERSTAND THE ABOVE INSTRUCTIONS.    EMERGENCY DEPT.  SIGNATURE ___________________ RN/PHYSICIAN  [place] ___________________ PATIENT  [phone]    SIGNATURE ___________________ RESPONSIBLE ADULT

## 2022-04-22 NOTE — Procedures (Signed)
1127 patient arrived to PACU and was hooked up to monitors    1220 Dr.Scrocco present at the bedside    1221 Time out performed    1221 Patient pre-medicated by anesthesia    1222 Shocked in sync mode at 200 joules and stayed in Atrial fibrillation    1223 Shocked in sync mode at 300 joules & converted to Sinus rhythm     1224 Resting, quiet easy to arouse & not complaining of any pain

## 2022-04-22 NOTE — Progress Notes (Signed)
[facility]                                                                                                                    PRE OP INSTRUCTIONS FOR  Jay Maxwell        Date: 04/21/2022    Date of surgery: 04/22/22   Arrival Time: Hospital will call you between 5pm and 7pm with your final arrival time for surgery    Do not eat or drink anything after midnight prior to surgery. This includes no water, chewing gum, mints or ice chips.    Take the following medications with a small sip of water on the morning of Surgery: Metoprolol, Eliquis and Amlodipine     Diabetics may take evening dose of insulin but none after midnight.  If you feel symptomatic or low blood sugar morning of surgery drink 1-2 ounces of apple juice only.    Aspirin, Ibuprofen, Advil, Naproxen, Vitamin E and other Anti-inflammatory products should be stopped  before surgery  as directed by your physician.  Take Tylenol only unless instructed otherwise by your surgeon.    Check with your Doctor regarding stopping Plavix, Coumadin, Lovenox, Eliquis, Effient, or other blood thinners.    Do not smoke,use illicit drugs and do not drink any alcoholic beverages 24 hours prior to surgery.    You may brush your teeth the morning of surgery.  DO NOT SWALLOW WATER    You MUST make arrangements for a responsible adult to take you home after your surgery. You will not be allowed to leave alone or drive yourself home.  It is strongly suggested someone stay with you the first 24 hrs. Your surgery will be cancelled if you do not have a ride home.    PEDIATRIC PATIENTS ONLY:  A parent/legal guardian must accompany a child scheduled for surgery and plan to stay at the hospital until the child is discharged.  Please do not bring other children with you.    Please wear simple, loose fitting clothing to the hospital.  Do not bring valuables (money, credit cards, checkbooks, etc.) Do not wear any makeup (including no eye makeup) or nail  polish on your fingers or toes.    DO NOT wear any jewelry or piercings on day of surgery.  All body piercing jewelry must be removed.    Shower the night before surgery with _x__Antibacterial soap /SAGE WIPES________    TOTAL JOINT REPLACEMENT/HYSTERECTOMY PATIENTS ONLY---Remember to bring Blood Bank bracelet to the hospital on the day of surgery.    If you have a Living Will and Durable Power of Attorney for Healthcare, please bring in a copy.    If appropriate bring crutches, inspirex, WALKER, CANE etc...    Notify your Surgeon if you develop any illness between now and surgery time, cough, cold, fever, sore throat, nausea, vomiting, etc.  Please notify your surgeon if you experience dizziness, shortness of breath or blurred vision between now & the time of your  surgery.    If you have ___dentures, they will be removed before going to the OR; we will provide you a container. If you wear ___contact lenses or _x__glasses, they will be removed; please bring a case for them.    To provide excellent care visitors will be limited to 2 in the room at any given time.    Please bring picture ID and insurance card.    Sleep apnea patients need to bring CPAP AND SETTINGS to hospital on day of surgery.                                                                                         During flu season no children under the age of 110 are permitted in the hospital for the safety of all patients.     Other                   Please call AMBULATORY CARE if you have any further questions.   [place]     [place]

## 2022-04-22 NOTE — Procedures (Signed)
CARDIOVERSION REPORT     CARDIOLOGIST: [name], MD    DATE OF SERVICE: 04/22/2022    PROCEDURE: DC Electrical Cardioversion    CURRENT MEDICATIONS PRIOR TO ENCOUNTER:  Current Outpatient Medications   Medication Sig Dispense Refill    apixaban (ELIQUIS) 5 MG TABS tablet Take 1 tablet by mouth 2 times daily 60 tablet 3    metoprolol succinate (TOPROL XL) 25 MG extended release tablet Take 1 tablet by mouth daily 90 tablet 1    amLODIPine (NORVASC) 5 MG tablet Take 1 tablet by mouth daily 90 tablet 1    ibuprofen (ADVIL;MOTRIN) 600 MG tablet Take 1 tablet by mouth 3 times daily as needed for Pain 90 tablet 5    acetaminophen (AMINOFEN) 325 MG tablet Take TWO with Ibuprofen every 8 hours as needed 120 tablet 3    vardenafil (LEVITRA) 20 MG tablet TAKE 1 TABLET BY MOUTH AS NEEDED FOR ERECTILE DYSFUNCTION 5 tablet 3     Current Facility-Administered Medications   Medication Dose Route Frequency Provider Last Rate Last Admin    lactated ringers IV soln infusion   IntraVENous Continuous Lazarus Gowda, MD   New Bag at 04/22/22 1211       HISTORY: Symptomatic atrial fibrillation    TECHNIQUE: Risks, benefits and alternatives to DC cardioversion were explained to the patient at length, and the patient acknowledged understanding. The patient was in a stable fasting condition. Cardiac rhythm, arterial oxygen saturation and blood pressure were continuously monitored. The patient was sedated by the Department of Anesthesiology.    RESULTS:  Patch/Paddle Position: Anterior/posterior  Energy (Joules): 200, then 300  Initial Rhythm: Atrial fibrillation  Outcome Rhythm: Sinus rhythm  COMPLICATIONS: None  CONCLUSION:  Successful electrical cardioversion of atrial fibrillation    Mauro Kaufmann, MD  [name]

## 2022-06-28 ENCOUNTER — Encounter

## 2022-06-30 MED ORDER — METOPROLOL SUCCINATE ER 25 MG PO TB24
25 MG | ORAL_TABLET | ORAL | 1 refills | Status: DC
Start: 2022-06-30 — End: 2023-03-04

## 2022-06-30 NOTE — Telephone Encounter (Signed)
Last Appointment:  03/18/2022  No future appointments.

## 2022-08-11 ENCOUNTER — Encounter

## 2022-08-11 MED ORDER — APIXABAN 5 MG PO TABS
5 MG | ORAL_TABLET | Freq: Two times a day (BID) | ORAL | 0 refills | Status: AC
Start: 2022-08-11 — End: 2022-09-10

## 2022-08-11 NOTE — Telephone Encounter (Signed)
Last Appointment:  03/18/2022  No future appointments.     Reduced quantity to 30 days supply and notified pharmacy patient needs an appointment for further refills

## 2022-08-26 ENCOUNTER — Encounter

## 2022-08-26 MED ORDER — AMLODIPINE BESYLATE 5 MG PO TABS
5 MG | ORAL_TABLET | Freq: Every day | ORAL | 1 refills | Status: AC
Start: 2022-08-26 — End: 2023-01-14

## 2022-08-26 NOTE — Telephone Encounter (Signed)
Last Appointment:  03/18/2022  No future appointments.

## 2023-01-06 NOTE — Telephone Encounter (Signed)
Pt calling req, an apt with Dr. Geoffry Paradise,  C/O of chest feels weird/tight/beating harder - can feel it for the past couple of weeks.  Last seen in [place] by Dr. Geoffry Paradise on: 04/17/22.

## 2023-01-06 NOTE — Telephone Encounter (Signed)
Spoke with patient and he states that he can do his daily activities but at work if he is doing something to fast he notices this.  He does not feel he needs to go to the ER.  He would like an appt for check up with you.  Please advise

## 2023-01-07 NOTE — Telephone Encounter (Signed)
Appt scheduled

## 2023-01-14 ENCOUNTER — Encounter
Admit: 2023-01-14 | Discharge: 2023-01-14 | Payer: BLUE CROSS/BLUE SHIELD | Attending: Cardiovascular Disease | Primary: Family Medicine

## 2023-01-14 DIAGNOSIS — I4891 Unspecified atrial fibrillation: Secondary | ICD-10-CM

## 2023-01-14 MED ORDER — APIXABAN 5 MG PO TABS
5 MG | ORAL_TABLET | Freq: Two times a day (BID) | ORAL | 0 refills | Status: AC
Start: 2023-01-14 — End: 2023-05-06

## 2023-01-14 MED ORDER — METOPROLOL SUCCINATE ER 100 MG PO TB24
100 MG | ORAL_TABLET | Freq: Every day | ORAL | 3 refills | Status: DC
Start: 2023-01-14 — End: 2023-03-04

## 2023-01-14 MED ORDER — AMLODIPINE BESYLATE 5 MG PO TABS
5 | ORAL_TABLET | Freq: Every day | ORAL | 1 refills | Status: AC
Start: 2023-01-14 — End: ?

## 2023-01-14 NOTE — Progress Notes (Signed)
Chief Complaint   Patient presents with    Atrial Fibrillation       Patient Active Problem List    Diagnosis Date Noted    Atrial fibrillation (Startup) 04/17/2022     Overview Note:     A. CHADSVASC = 1  B. DCCV 04/22/2022      Primary hypertension 03/18/2022    Mixed hyperlipidemia 11/17/2016    Restless leg        Current Outpatient Medications   Medication Sig Dispense Refill    apixaban (ELIQUIS) 5 MG TABS tablet Take 1 tablet by mouth 2 times daily 180 tablet 0    amLODIPine (NORVASC) 5 MG tablet Take 1 tablet by mouth daily 90 tablet 1    metoprolol succinate (TOPROL XL) 100 MG extended release tablet Take 1 tablet by mouth daily 90 tablet 3    ibuprofen (ADVIL;MOTRIN) 600 MG tablet Take 1 tablet by mouth 3 times daily as needed for Pain 90 tablet 5    metoprolol succinate (TOPROL XL) 25 MG extended release tablet TAKE 1 TABLET BY MOUTH EVERY DAY (Patient not taking: Reported on 01/14/2023) 90 tablet 1     No current facility-administered medications for this visit.        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          Physical Exam   BP (!) 145/107 (Site: Right Upper Arm, Position: Supine)   Pulse (!) 122   Resp 20   Ht 1.829 m (6')   Wt 132.5 kg (292 lb)   BMI 39.60 kg/m   Constitutional: Oriented to person, place, and time. Obese No distress.    Neck: No JVD present. Carotid bruit is not present.   Cardiovascular: Normal rate, irregular rhythm, normal heart sounds and intact distal pulses.  No gallop and no friction rub.  No murmur heard.  Pulmonary/Chest: Effort  normal and breath sounds normal.   Abdominal: Soft. Bowel sounds are normal. No distension and no mass.   Musculoskeletal: No edema   Neurological: Alert and oriented to person, place, and time.   Skin: Skin is warm and dry. No rash noted.  Psychiatric: Normal mood and affect. Behavior is normal.     EKG:  atrial fibrillation, rate rapid     ASSESSMENT AND PLAN:  Patient Active Problem List   Diagnosis    Restless leg    Mixed hyperlipidemia    Primary hypertension    Atrial fibrillation (HCC)     Persistent atrial fibrillation, symptomatic, likely non valvular, CHADSVASC = 1  Will restart him on rate control - toprol xl 100 qd, as well as antihypertensive - norvasc 5 mg, and eliquis - all ordered  See back in a month.  If he does not flip to sinus will do DCCV and refer to EP  for rhythm control strategy    OV 1 month      Isidor Holts, M.D  [facility]

## 2023-02-17 ENCOUNTER — Encounter: Payer: BLUE CROSS/BLUE SHIELD | Attending: Cardiovascular Disease | Primary: Family Medicine

## 2023-03-04 ENCOUNTER — Encounter
Admit: 2023-03-04 | Discharge: 2023-03-04 | Payer: BLUE CROSS/BLUE SHIELD | Attending: Cardiovascular Disease | Primary: Family Medicine

## 2023-03-04 ENCOUNTER — Encounter

## 2023-03-04 DIAGNOSIS — I4819 Other persistent atrial fibrillation: Secondary | ICD-10-CM

## 2023-03-04 MED ORDER — METOPROLOL SUCCINATE ER 50 MG PO TB24
50 | ORAL_TABLET | Freq: Every day | ORAL | 5 refills | Status: AC
Start: 2023-03-04 — End: ?

## 2023-03-04 NOTE — Progress Notes (Signed)
Chief Complaint   Patient presents with    Atrial Fibrillation       Patient Active Problem List    Diagnosis Date Noted    Morbid obesity (HCC) 03/04/2023    Atrial fibrillation (HCC) 04/17/2022     Overview Note:     A. CHADSVASC = 1  B. DCCV 04/22/2022      Primary hypertension 03/18/2022    Mixed hyperlipidemia 11/17/2016    Restless leg        Current Outpatient Medications   Medication Sig Dispense Refill    metoprolol succinate (TOPROL XL) 50 MG extended release tablet Take 1 tablet by mouth daily 30 tablet 5    apixaban (ELIQUIS) 5 MG TABS tablet Take 1 tablet by mouth 2 times daily 180 tablet 0    amLODIPine (NORVASC) 5 MG tablet Take 1 tablet by mouth daily 90 tablet 1    ibuprofen (ADVIL;MOTRIN) 600 MG tablet Take 1 tablet by mouth 3 times daily as needed for Pain 90 tablet 5     No current facility-administered medications for this visit.        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          Physical Exam   BP (!) 138/94   Pulse (!) 114   Resp 16   Ht 1.829 m (6')   Wt 132.3 kg (291 lb 9.6 oz)   BMI 39.55 kg/m   Constitutional: Oriented to person, place, and time. Obese No distress.    Neck: No JVD present. Carotid bruit is not present.   Cardiovascular: rapid rate, irregular rhythm, normal heart sounds and intact distal pulses.  No gallop and no friction rub.  No murmur heard.  Pulmonary/Chest: Effort normal and breath sounds normal.   Abdominal: Soft. Bowel sounds are normal. No distension and no mass.   Musculoskeletal: No edema   Neurological: Alert and  oriented to person, place, and time.   Skin: Skin is warm and dry. \  Psychiatric: Normal mood and affect. Behavior is normal.     EKG:  atrial fibrillation, rate rapid     ASSESSMENT AND PLAN:  Patient Active Problem List   Diagnosis    Restless leg    Mixed hyperlipidemia    Primary hypertension    Atrial fibrillation (HCC)    Morbid obesity (HCC)     Persistent atrial fibrillation, symptomatic, likely non valvular, CHADSVASC = 1  Reordered toprol xl 50 qd  Will do TEE guided DCCV - strict OAC compliance unlikely, plus can evaluate for structural disease  Discussion of  DCCV procedure and TEE at length      [name], M.D  [facility]

## 2023-03-15 MED ORDER — IBUPROFEN 600 MG PO TABS
600 | ORAL_TABLET | ORAL | 0 refills | Status: AC
Start: 2023-03-15 — End: ?

## 2023-03-15 NOTE — Telephone Encounter (Signed)
Last Appointment:  03/18/2022  Future Appointments   Date Time Provider Department Center   03/30/2023  8:30 AM SEY ECHO 2 [facility]    Appt task assigned

## 2023-03-29 NOTE — Telephone Encounter (Signed)
Reminded patient of scheduled procedure on 6/4.  Instructions given.

## 2023-03-30 ENCOUNTER — Encounter: Payer: BLUE CROSS/BLUE SHIELD | Attending: Anesthesiology | Primary: Family Medicine

## 2023-03-30 VITALS — BP 118/88 | HR 65 | Temp 98.00000°F | Resp 20 | Wt 290.0 lb

## 2023-03-30 DIAGNOSIS — I480 Paroxysmal atrial fibrillation: Principal | ICD-10-CM

## 2023-03-30 LAB — EKG 12-LEAD
Atrial Rate: 70 {beats}/min
P Axis: 38 degrees
P-R Interval: 202 ms
Q-T Interval: 412 ms
QRS Duration: 84 ms
QTc Calculation (Bazett): 444 ms
R Axis: -18 degrees
T Axis: -4 degrees
Ventricular Rate: 70 {beats}/min

## 2023-03-30 LAB — TEE W/ POSSIBLE CARDIOVERSION (PRN CONTRAST/BUBBLE/3D)
EF Physician: 50 %
TR Max Velocity: 1.99 m/s
TR Peak Gradient: 16 mmHg

## 2023-03-30 MED ORDER — PROPOFOL 200 MG/20ML IV EMUL
200 | INTRAVENOUS | Status: AC
Start: 2023-03-30 — End: ?

## 2023-03-30 MED ORDER — SODIUM CHLORIDE 0.9 % IV SOLN
0.9 % | INTRAVENOUS | Status: DC | PRN
  Administered 2023-03-30: 13:00:00 via INTRAVENOUS

## 2023-03-30 MED ORDER — PROPOFOL 200 MG/20ML IV EMUL
200 MG/20ML | INTRAVENOUS | Status: DC | PRN
  Administered 2023-03-30: 13:00:00 250 via INTRAVENOUS

## 2023-03-30 MED FILL — PROPOFOL 200 MG/20ML IV EMUL: 200 MG/20ML | INTRAVENOUS | Qty: 40

## 2023-03-30 NOTE — Telephone Encounter (Signed)
-----   Message from Sherril Croon, MD sent at 03/30/2023 10:26 AM EDT -----  Hfu one month  ----- Message -----  From: [name], MD  Sent: 03/30/2023  10:18 AM EDT  To: Sherril Croon, MD

## 2023-03-30 NOTE — Telephone Encounter (Signed)
Appt scheduled

## 2023-03-30 NOTE — Anesthesia Pre-Procedure Evaluation (Signed)
Department of Anesthesiology  Preprocedure Note       Name:  Jay Maxwell   Age:  52 y.o.  DOB:  05-Jan-1971                                          MRN:  [id number]         Date:  03/30/2023      Surgeon: Candie Mile    Procedure: TEE/DCCV    Medications prior to admission:   Prior to Admission medications    Medication Sig Start Date End Date Taking? Authorizing Provider   ibuprofen (ADVIL;MOTRIN) 600 MG tablet TAKE 1 TABLET BY MOUTH THREE TIMES A DAY AS NEEDED FOR PAIN 03/15/23   Catterlin, Ula Lingo, DO   metoprolol succinate (TOPROL XL) 50 MG extended release tablet Take 1 tablet by mouth daily 03/04/23   Sherril Croon, MD   apixaban Everlene Balls) 5 MG TABS tablet Take 1 tablet by mouth 2 times daily 01/14/23 04/14/23  Sherril Croon, MD   amLODIPine (NORVASC) 5 MG tablet Take 1 tablet by mouth daily 01/14/23   Sherril Croon, MD       Current medications:    Current Outpatient Medications   Medication Sig Dispense Refill    ibuprofen (ADVIL;MOTRIN) 600 MG tablet TAKE 1 TABLET BY MOUTH THREE TIMES A DAY AS NEEDED FOR PAIN 90 tablet 0    metoprolol succinate (TOPROL XL) 50 MG extended release tablet Take 1 tablet by mouth daily 30 tablet 5    apixaban (ELIQUIS) 5 MG TABS tablet Take 1 tablet by mouth 2 times daily 180 tablet 0    amLODIPine (NORVASC) 5 MG tablet Take 1 tablet by mouth daily 90 tablet 1     No current facility-administered medications for this encounter.       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    Diverticulitis     Hernia     Mixed hyperlipidemia 11/17/2016    Primary hypertension 03/18/2022    Restless leg        Past Surgical History:        Procedure Laterality Date    ABDOMEN SURGERY      11  inches with colostomy with reversal    APPENDECTOMY      HERNIA REPAIR  2008    OTHER SURGICAL HISTORY  09/19/2014    laparoscopic incisional hernia repair with mesh    TONSILLECTOMY         Social History:    Social History     Tobacco Use    Smoking status: Never    Smokeless tobacco: Never   Substance Use Topics    Alcohol use: No     Comment: social                                Counseling given: Not Answered  Vital Signs (Current):   There were no vitals filed for this visit.                                             BP Readings from Last 3 Encounters:   03/04/23 (!) 138/94   01/14/23 (!) 145/107   04/22/22 (!) 140/83       NPO Status:                                                                                 BMI:   Wt Readings from Last 3 Encounters:   03/04/23 132.3 kg (291 lb 9.6 oz)   01/14/23 132.5 kg (292 lb)   04/22/22 131.1 kg (289 lb)     There is no height or weight on file to calculate BMI.    CBC:   Lab Results   Component Value Date/Time    WBC 8.6 02/25/2022 01:48 PM    RBC 5.86 02/25/2022 01:48 PM    HGB 16.3 02/25/2022 01:48 PM    HCT 50.0 02/25/2022 01:48 PM    MCV 85.3 02/25/2022 01:48 PM    RDW 13.5 02/25/2022 01:48 PM    PLT 274 02/25/2022 01:48 PM       CMP:   Lab Results   Component Value Date/Time    NA 138 02/25/2022 01:48 PM    K 4.2 02/25/2022 01:48 PM    CL 103 02/25/2022 01:48 PM    CO2 26 02/25/2022 01:48 PM    BUN 10 02/25/2022 01:48 PM    CREATININE 0.8 02/25/2022 01:48 PM    GFRAA >60 07/26/2019 09:03 AM    LABGLOM >60 02/25/2022 01:48 PM    GLUCOSE 118 02/25/2022 01:48 PM    CALCIUM 9.4 02/25/2022 01:48 PM    BILITOT 0.3 02/25/2022 01:48 PM    ALKPHOS 74 02/25/2022 01:48 PM    AST 17 02/25/2022 01:48 PM    ALT 29 02/25/2022 01:48 PM       POC Tests: No results for input(s): "POCGLU", "POCNA", "POCK", "POCCL", "POCBUN", "POCHEMO", "POCHCT" in the last 72 hours.    Coags:   Lab Results   Component Value Date/Time    PROTIME 12.7 12/14/2015 10:30 AM    INR 1.1  12/14/2015 10:30 AM    APTT 29.0 12/14/2015 10:30 AM       HCG (If Applicable): No results found for: "PREGTESTUR", "PREGSERUM", "HCG", "HCGQUANT"     ABGs: No results found for: "PHART", "PO2ART", "PCO2ART", "HCO3ART", "BEART", "O2SATART"     Type & Screen (If Applicable):  No results found for: "LABABO"    Drug/Infectious Status (If Applicable):  No results found for: "HIV", "HEPCAB"    COVID-19 Screening (If Applicable): No results found for: "COVID19"        Anesthesia Evaluation  Patient summary reviewed and Nursing notes reviewed   no history of anesthetic complications:   Airway: Mallampati: II     Neck ROM: full  Mouth opening: > = 3 FB   Dental: normal exam  Pulmonary: breath sounds clear to auscultation                             Cardiovascular:    (+) hypertension:, angina: with exertion, dysrhythmias: atrial fibrillation and atrial flutter, DOE: after ambulating 2 flights of stairs, hyperlipidemia      ECG reviewed  Rhythm: irregular  Rate: normal           Beta Blocker:  Dose within 24 Hrs      ROS comment: EKG 03/04/23:  Atrial flutter/fibrillation     Neuro/Psych:   Negative Neuro/Psych ROS              GI/Hepatic/Renal:   (+) morbid obesity          Endo/Other:    (+) blood dyscrasia (eliquis LD 6.28.23): anticoagulation therapy, arthritis:..          Pt had no PAT visit       Abdominal:   (+) obese          Vascular: negative vascular ROS.         Other Findings:             Anesthesia Plan      MAC     ASA 3       Induction: intravenous.  continuous noninvasive hemodynamic monitor    Anesthetic plan and risks discussed with patient.      Plan discussed with attending.                    Ronette Deter, APRN - CRNA   03/30/2023

## 2023-03-30 NOTE — Progress Notes (Signed)
At time of discharge the patient was voicing no complaints, and verbalized readiness for discharge.  The patient was able to drink water and change self without assistance.  The patient was ambulatory around procedure room without difficulty.  Patient had no unanswered questions.  Patient escorted to 1st floor, family member present and driving patient home.

## 2023-03-30 NOTE — Anesthesia Post-Procedure Evaluation (Signed)
Department of Anesthesiology  Postprocedure Note    Patient: Jay Maxwell  MRN: [id number]  Birthdate: January 12, 1971  Date of evaluation: 03/30/2023    Procedure Summary       Date: 03/30/23 Room / Location: [facility]; [facility]    Anesthesia Start: [phone] Anesthesia Stop: 0905    Procedure: TEE W/ POSSIBLE CARDIOVERSION (PRN CONTRAST/BUBBLE/3D) Diagnosis: Paroxysmal atrial fibrillation (HCC)    Scheduled Providers: Otila Kluver, MD Responsible Provider: Otila Kluver, MD    Anesthesia Type: MAC ASA Status: 3            Anesthesia Type: MAC    Aldrete Phase I:      Aldrete Phase II:      Anesthesia Post Evaluation    Patient location during evaluation: PACU  Patient participation: complete - patient participated  Level of consciousness: awake and alert  Airway patency: patent  Nausea & Vomiting: no nausea and no vomiting  Cardiovascular status: blood pressure returned to baseline and hemodynamically stable  Respiratory status: acceptable and spontaneous ventilation  Hydration status: euvolemic  Multimodal analgesia pain management approach  Pain management: adequate    No notable events documented.

## 2023-03-30 NOTE — H&P (Signed)
No chief complaint on file.      Patient Active Problem List    Diagnosis Date Noted    Morbid obesity (HCC) 03/04/2023    Atrial fibrillation (HCC) 04/17/2022     Overview Note:     A. CHADSVASC = 1  B. DCCV 04/22/2022      Primary hypertension 03/18/2022    Mixed hyperlipidemia 11/17/2016    Restless leg        Current Outpatient Medications   Medication Sig Dispense Refill    ibuprofen (ADVIL;MOTRIN) 600 MG tablet TAKE 1 TABLET BY MOUTH THREE TIMES A DAY AS NEEDED FOR PAIN 90 tablet 0    metoprolol succinate (TOPROL XL) 50 MG extended release tablet Take 1 tablet by mouth daily 30 tablet 5    apixaban (ELIQUIS) 5 MG TABS tablet Take 1 tablet by mouth 2 times daily 180 tablet 0    amLODIPine (NORVASC) 5 MG tablet Take 1 tablet by mouth daily 90 tablet 1     No current facility-administered medications for this encounter.        No Known Allergies    Vitals:    03/30/23 0920 03/30/23 0925 03/30/23 0935 03/30/23 0944   BP: 109/83 (!) 124/91 120/85 118/88   Pulse: 67 67 63 65   Resp: 17 17 17 20    Temp:       TempSrc:       SpO2: 95% 97% 100% 100%   Weight:                     SUBJECTIVE: Jay Maxwell presents to the office today for f/u  Did restart his eliquis, but seems like some skipped doses  Did not restart his BB, yes on amlodipine        He complains of fluttering in his chest and sob for a month  No alcohol, no OSA dx  Works [employer] and does all AODLs around home.          Physical Exam   BP 118/88   Pulse 65   Temp 98 F (36.7 C) (Temporal)   Resp 20   Wt 131.5 kg (290 lb)   SpO2 100%   BMI 39.33 kg/m   Constitutional: Oriented to person, place, and time. Obese No distress.    Neck: No JVD present. Carotid bruit is not present.   Cardiovascular: rapid rate, irregular rhythm, normal heart sounds and intact distal pulses.  No gallop and no friction rub.  No murmur heard.  Pulmonary/Chest: Effort normal and breath sounds normal.   Abdominal: Soft. Bowel sounds are normal. No distension  and no mass.   Musculoskeletal: No edema   Neurological: Alert and oriented to person, place, and time.   Skin: Skin is warm and dry. \  Psychiatric: Normal mood and affect. Behavior is normal.     EKG:  atrial fibrillation, rate rapid     ASSESSMENT AND PLAN:  Patient Active Problem List   Diagnosis    Restless leg    Mixed hyperlipidemia    Primary hypertension    Atrial fibrillation (HCC)    Morbid obesity (HCC)     Persistent atrial fibrillation, symptomatic, likely non valvular, CHADSVASC = 1  Reordered toprol xl 50 qd  Will do TEE guided DCCV - strict OAC compliance unlikely, plus can evaluate for structural disease  Discussion of  DCCV procedure and TEE at length    [name], M.D  [facility]  __________________________________________________________________  No new changes since the above evaluation by Dr. [name] on 03/04/23. Will proceed with TEE and cardioversion today as planned.    [name], MD

## 2023-04-12 NOTE — Telephone Encounter (Signed)
Last Appointment:  03/18/2022  Future Appointments   Date Time Provider Department Center   05/06/2023 10:00 AM Hunt, Jonelle Sports, MD [facility]

## 2023-05-06 ENCOUNTER — Ambulatory Visit
Admit: 2023-05-06 | Discharge: 2023-05-06 | Payer: BLUE CROSS/BLUE SHIELD | Attending: Cardiovascular Disease | Primary: Family Medicine

## 2023-05-06 VITALS — BP 128/86 | HR 105 | Resp 18 | Ht 72.0 in | Wt 294.2 lb

## 2023-05-06 DIAGNOSIS — I4819 Other persistent atrial fibrillation: Principal | ICD-10-CM

## 2023-05-06 NOTE — Progress Notes (Signed)
Chief Complaint   Patient presents with    Atrial Fibrillation       Patient Active Problem List    Diagnosis Date Noted    Morbid obesity (HCC) 03/04/2023    Atrial fibrillation (HCC) 04/17/2022     Overview Note:     A. CHADSVASC = 1  B. DCCV 04/22/2022  C. TEE guided DCCV 03/30/2023 with ERAF 05/06/2023      Primary hypertension 03/18/2022    Mixed hyperlipidemia 11/17/2016    Restless leg        Current Outpatient Medications   Medication Sig Dispense Refill    ibuprofen (ADVIL;MOTRIN) 600 MG tablet TAKE 1 TABLET BY MOUTH THREE TIMES A DAY AS NEEDED FOR PAIN 90 tablet 0    metoprolol succinate (TOPROL XL) 50 MG extended release tablet Take 1 tablet by mouth daily 30 tablet 5    apixaban (ELIQUIS) 5 MG TABS tablet Take 1 tablet by mouth 2 times daily 180 tablet 0    amLODIPine (NORVASC) 5 MG tablet Take 1 tablet by mouth daily 90 tablet 1     No current facility-administered medications for this visit.        No Known Allergies    Vitals:    05/06/23 1004   BP: 128/86   Pulse: (!) 105   Resp: 18   Weight: 133.4 kg (294 lb 3.2 oz)   Height: 1.829 m (6')                 SUBJECTIVE: Jay Maxwell presents to the office today for f/u after TEE guided DCCV - successful, no structural disease    No alcohol, no OSA dx  Works [employer] and does all AODLs around home.          Physical Exam   BP 128/86   Pulse (!) 105   Resp 18   Ht 1.829 m (6')   Wt 133.4 kg (294 lb 3.2 oz)   BMI 39.90 kg/m   Constitutional: Oriented to person, place, and time. Obese No distress.    Neck: No JVD present. Carotid bruit is not present.   Cardiovascular: rapid rate, irregular rhythm, normal heart sounds and intact distal pulses.  No gallop and no friction rub.  No murmur heard.  Pulmonary/Chest: Effort normal and breath sounds normal.   Abdominal: Soft. Bowel sounds are normal. No distension and no mass.   Musculoskeletal: No edema   Neurological: Alert and oriented to person, place, and time.   Skin: Skin is warm and dry.    Psychiatric: Normal mood and affect. Behavior is normal.     EKG:  atrial fibrillation, rate rapid     ASSESSMENT AND PLAN:  Patient Active Problem List   Diagnosis    Restless leg    Mixed hyperlipidemia    Primary hypertension    Atrial fibrillation (HCC)    Morbid obesity (HCC)     Persistent atrial fibrillation, minimally symptomatic, non valvular, CHADSVASC = 1  Discussion of atrial fibrillation, including symptoms, thromboembolism potential, rate vs rhythm control, and have opted for the former  Increase metoprolol to bid dosing  Stressed compliance with NOAC    OV 6 months      [name], M.D  [facility]

## 2023-05-06 NOTE — Patient Instructions (Signed)
TAKE THE METOPROLOL TWICE PER DAY WITH THE Everlene Balls

## 2023-05-08 ENCOUNTER — Encounter

## 2023-05-10 MED ORDER — ELIQUIS 5 MG PO TABS
5 MG | ORAL_TABLET | Freq: Two times a day (BID) | ORAL | 5 refills | Status: DC
Start: 2023-05-10 — End: 2023-11-15

## 2023-07-06 MED ORDER — METOPROLOL SUCCINATE ER 50 MG PO TB24
50 | ORAL_TABLET | Freq: Every day | ORAL | 1 refills | Status: DC
Start: 2023-07-06 — End: 2023-07-16

## 2023-07-06 NOTE — Telephone Encounter (Signed)
 Pt needs appt for refill.     Electronically signed by Ileene Musa, MA on 07/06/23 at 8:11 AM EDT

## 2023-07-13 ENCOUNTER — Encounter

## 2023-07-14 MED ORDER — AMLODIPINE BESYLATE 5 MG PO TABS
5 MG | ORAL_TABLET | Freq: Every day | ORAL | 1 refills | Status: DC
Start: 2023-07-14 — End: 2024-04-10

## 2023-07-16 MED ORDER — METOPROLOL SUCCINATE ER 50 MG PO TB24
50 MG | ORAL_TABLET | Freq: Two times a day (BID) | ORAL | 1 refills | Status: DC
Start: 2023-07-16 — End: 2023-11-15

## 2023-07-22 ENCOUNTER — Inpatient Hospital Stay
Admit: 2023-07-22 | Discharge: 2023-07-23 | Disposition: A | Discharge status: Home / Self Care | Payer: BLUE CROSS/BLUE SHIELD | Attending: Student in an Organized Health Care Education/Training Program

## 2023-07-22 DIAGNOSIS — R0789 Other chest pain: Secondary | ICD-10-CM

## 2023-07-22 LAB — CBC WITH AUTO DIFFERENTIAL
Basophils %: 0 % (ref 0.0–2.0)
Basophils Absolute: 0.02 10*3/uL (ref 0.00–0.20)
Eosinophils %: 1 % (ref 0–6)
Eosinophils Absolute: 0.12 10*3/uL (ref 0.05–0.50)
Hematocrit: 53.1 % (ref 37.0–54.0)
Hemoglobin: 17.9 g/dL — ABNORMAL HIGH (ref 12.5–16.5)
Immature Granulocytes %: 1 % (ref 0.0–5.0)
Immature Granulocytes Absolute: 0.07 10*3/uL (ref 0.00–0.58)
Lymphocytes %: 19 % — ABNORMAL LOW (ref 20.0–42.0)
Lymphocytes Absolute: 2.21 10*3/uL (ref 1.50–4.00)
MCH: 28.8 pg (ref 26.0–35.0)
MCHC: 33.7 g/dL (ref 32.0–34.5)
MCV: 85.5 fL (ref 80.0–99.9)
MPV: 9.5 fL (ref 7.0–12.0)
Monocytes %: 7 % (ref 2.0–12.0)
Monocytes Absolute: 0.8 10*3/uL (ref 0.10–0.95)
Neutrophils %: 72 % (ref 43.0–80.0)
Neutrophils Absolute: 8.44 10*3/uL — ABNORMAL HIGH (ref 1.80–7.30)
Platelets: 352 10*3/uL (ref 130–450)
RBC: 6.21 m/uL — ABNORMAL HIGH (ref 3.80–5.80)
RDW: 13 % (ref 11.5–15.0)
WBC: 11.7 10*3/uL — ABNORMAL HIGH (ref 4.5–11.5)

## 2023-07-22 LAB — COMPREHENSIVE METABOLIC PANEL
ALT: 36 U/L (ref 0–40)
AST: 25 U/L (ref 0–39)
Albumin: 4.1 g/dL (ref 3.5–5.2)
Alkaline Phosphatase: 80 U/L (ref 40–129)
Anion Gap: 10 mmol/L (ref 7–16)
BUN: 11 mg/dL (ref 6–20)
CO2: 27 mmol/L (ref 22–29)
Calcium: 9.9 mg/dL (ref 8.6–10.2)
Chloride: 101 mmol/L (ref 98–107)
Creatinine: 1 mg/dL (ref 0.70–1.20)
Est, Glom Filt Rate: 89 mL/min/{1.73_m2} (ref >60)
Glucose: 101 mg/dL — ABNORMAL HIGH (ref 74–99)
Potassium: 4.1 mmol/L (ref 3.5–5.0)
Sodium: 138 mmol/L (ref 132–146)
Total Bilirubin: 0.5 mg/dL (ref 0.0–1.2)
Total Protein: 7.6 g/dL (ref 6.4–8.3)

## 2023-07-22 LAB — D-DIMER, QUANTITATIVE: D-Dimer, Quant: <200 ng/mL (ref 0–230)

## 2023-07-22 LAB — MAGNESIUM: Magnesium: 2 mg/dL (ref 1.6–2.6)

## 2023-07-22 LAB — TROPONIN: Troponin, High Sensitivity: <6 ng/L (ref 0–11)

## 2023-07-22 MED ORDER — SODIUM CHLORIDE 0.9 % IV BOLUS
0.9 | Freq: Once | INTRAVENOUS | Status: AC
Start: 2023-07-22 — End: 2023-07-22

## 2023-07-22 MED ADMIN — sodium chloride 0.9 % bolus 1,000 mL: 1000 mL | INTRAVENOUS | @ 23:00:00 | NDC 00338004904

## 2023-07-22 NOTE — ED Notes (Signed)
 EKG done in triage

## 2023-07-22 NOTE — ED Provider Notes (Signed)
 [facility] EMERGENCY DEPARTMENT  EMERGENCY DEPARTMENT ENCOUNTER    Pt Name: Jay Maxwell  MRN: [id number]  Birthdate [birth year]  Date of evaluation: 07/22/2023  Provider: Cassondra Craft, MD  PCP: Caleb Charlie Ruth, DO  Note Started: 6:52 PM EDT 07/22/23    HPI     I personally saw Jay Maxwell and made/approved the management plan and take responsibility for the patient management.  This will serve as my supervisory note and shared attestation.  I did perform a substantiative portion of the visit including all aspects of the medical decision making.    In brief, Jay Maxwell is a 52 y.o. that presents to the emergency department .  Chest pain.  Patient stated that he had a brief episode where he felt some chest pressure.  This completely resolved.  Patient was short of breath and was worried about his A-fib.  Patient also stated that he was worried about a blood clot in his lungs.  Denies any fevers, chills, nausea, vomiting, abdominal pain, change in urinary or bowels.    Nursing Notes were all reviewed and agreed with or any disagreements were addressed in the HPI.    History From: Patient    Review of Systems   Pertinent positives and negatives as per HPI.     Focused exam:  Physical Exam  Vitals and nursing note reviewed.   Constitutional:       Appearance: He is well-developed.   HENT:      Head: Normocephalic and atraumatic.   Eyes:      Conjunctiva/sclera: Conjunctivae normal.   Cardiovascular:      Rate and Rhythm: Normal rate. Rhythm irregular.      Heart sounds: Normal heart sounds. No murmur heard.  Pulmonary:      Effort: Pulmonary effort is normal. No respiratory distress.      Breath sounds: Normal breath sounds. No wheezing or rales.   Abdominal:      General: Bowel sounds are normal.      Palpations: Abdomen is soft.      Tenderness: There is no abdominal tenderness. There is no guarding or rebound.   Musculoskeletal:         General: No tenderness or  deformity.      Cervical back: Normal range of motion and neck supple.   Skin:     General: Skin is warm and dry.   Neurological:      Mental Status: He is alert and oriented to person, place, and time.      Cranial Nerves: No cranial nerve deficit.      Coordination: Coordination normal.              MDM     Amount and/or Complexity of Data Reviewed  Decide to obtain previous medical records or to obtain history from someone other than the patient: yes       EKG read interpreted by me.  Rate 94 bpm.  Left axis deviation.  Irregularly irregular rhythm.  No ST elevations or depressions.  Nonspecific T wave abnormality.  Most notable in the inferior leads..  Prior EKG on March 30, 2023 with atrial fib  All diagnostic, treatment, and disposition decisions were made by myself in conjunction with the resident.  I also supervised key portions of any procedures performed by the resident.  For all further details of the patient's emergency department visit, please see their documentation.    ED Course as of  07/22/23 2301   Thu Jul 22, 2023   2028 EKG demonstrates atrial fibrillation with a ventricular of 94 bpm.  No acute ST-T wave changes when compared with prior EKG on 05/06/2023.  QTc 430 MS. [VG]      ED Course User Index  [VG] Twanna Durham, MD      Patient is a 52 y.o. male presenting with chest pain.  Patient did have an EKG without significant findings.  Troponin was negative.  Magnesium  is normal.  Patient's D-dimer is negative.  Patient otherwise clinically appears well and be discharged home. .              CONSULTS:   None    Medications given in the emergency room:  Medications   sodium chloride  0.9 % bolus 1,000 mL (0 mLs IntraVENous Stopped 07/22/23 1917)                RADIOLOGY:   Non-plain film images such as CT, Ultrasound and MRI are read by the radiologist. Plain radiographic images are visualized and preliminarily interpreted by the ED Provider with the below findings:        Interpretation per the  Radiologist below, if available at the time of this note:    XR CHEST (2 VW)   Final Result   No acute process.           No results found.    No results found.  Cassondra Craft, MD                  Clinical Impression  1. Chest pain, unspecified type          Disposition  Patient's disposition: Discharge to home  Patient's condition is good.                               Craft Cassondra, MD  07/22/23 [phone]

## 2023-07-22 NOTE — ED Provider Notes (Signed)
 [facility] EMERGENCY DEPARTMENT  EMERGENCY DEPARTMENT ENCOUNTER        Pt Name: Jay Maxwell  MRN: [id number]  Birthdate Sep 07, 1971  Date of evaluation: 07/22/2023  Provider: Carolyn Slater, MD  Attending Provider: Gladis Dolphin, MD  PCP: Caleb Charlie Ruth, DO  Note Started: 8:43 PM EDT 07/22/23    CHIEF COMPLAINT       Chief Complaint   Patient presents with    Chest Pain       HISTORY OF PRESENT ILLNESS: 1 or more Elements   History From: the patient        Jay Maxwell is a 52 y.o. 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  Patient states that he was diagnosed with COVID 2 weeks ago.  For the last 2 or 3 days, he has been having chest tightness with associated shortness of breath that is worse with exertion.  His most recent COVID testing has been negative.  He did call his doctor and he was sent to the emergency room with concern for possible pulmonary embolism.  He reports that he has been compliant with his Eliquis  and has not missed any doses.  He denies any sharp chest pain.  Denies any nausea or vomiting or diaphoresis.    Nursing Notes were all reviewed and agreed with or any disagreements were addressed in the HPI.      REVIEW OF EXTERNAL NOTE :           PDMP Monitoring:    Last PDMP Mark as Reviewed:  Review User Review Instant Review Result            Urine Drug Screenings (1 yr)       Urine Drug Screen  Collected: 11/13/2016 12:00 PM (Final result)                  Medication Contract and Consent for Opioid Use Documents Filed        No documents found                      REVIEW OF SYSTEMS :           Positives and Pertinent negatives as per HPI.     SURGICAL HISTORY     Past Surgical History:   Procedure Laterality Date    ABDOMEN SURGERY      12 inches with colostomy with reversal    APPENDECTOMY      HERNIA REPAIR  2008    OTHER SURGICAL HISTORY  09/19/2014     laparoscopic incisional hernia repair with mesh    TONSILLECTOMY         CURRENTMEDICATIONS       Previous Medications    AMLODIPINE  (NORVASC ) 5 MG TABLET    TAKE 1 TABLET BY MOUTH EVERY DAY    ELIQUIS  5 MG TABS TABLET    TAKE 1 TABLET BY MOUTH TWICE A DAY    IBUPROFEN  (ADVIL ;MOTRIN ) 600 MG TABLET    TAKE 1 TABLET BY MOUTH THREE TIMES A DAY AS NEEDED FOR PAIN    METOPROLOL  SUCCINATE (TOPROL  XL) 50 MG EXTENDED RELEASE TABLET    Take 1 tablet by mouth 2 times daily       ALLERGIES     Patient has no known allergies.    FAMILYHISTORY       Family History   Problem Relation Age of  Onset    Heart Disease Mother     High Blood Pressure Mother     Diabetes Mother     High Blood Pressure Father     Diabetes Father     Heart Disease Father         SOCIAL HISTORY       Social History     Tobacco Use    Smoking status: Never    Smokeless tobacco: Never   Vaping Use    Vaping status: Never Used   Substance Use Topics    Alcohol use: No     Comment: social    Drug use: No       SCREENINGS        Glasgow Coma Scale  Eye Opening: Spontaneous  Best Verbal Response: Oriented  Best Motor Response: Obeys commands  Glasgow Coma Scale Score: 15                CIWA Assessment  BP: (!) 139/98  Pulse: 92           PHYSICAL EXAM  1 or more Elements     ED Triage Vitals   BP Systolic BP Percentile Diastolic BP Percentile Temp Temp Source Pulse Respirations SpO2   07/22/23 1735 -- -- 07/22/23 1735 07/22/23 1735 07/22/23 1735 07/22/23 1735 07/22/23 1735   (!) 139/98   97.4 F (36.3 C) Infrared 92 18 98 %      Height Weight - Scale         -- 07/22/23 1741          129.3 kg (285 lb)                      Constitutional/General: Alert and oriented x3  Head: Normocephalic and atraumatic  Eyes: PERRL, EOMI, conjunctiva normal, sclera non icteric  ENT:  Oropharynx clear, handling secretions, no trismus, no asymmetry of the posterior oropharynx or uvular edema  Neck: Supple, full ROM, no stridor, no meningeal signs  Respiratory: Lungs clear to  auscultation bilaterally, no wheezes, rales, or rhonchi. Not in respiratory distress  Cardiovascular:  Regular rate.  Irregularly irregular rhythm. 2+ distal pulses. Equal extremity pulses.   Chest: No chest wall tenderness  GI:  Abdomen Soft, Non tender, Non distended.  No rebound, guarding, or rigidity. No pulsatile masses.  Musculoskeletal: Moves all extremities x 4. Warm and well perfused, no clubbing, no cyanosis, no edema. Capillary refill <3 seconds  Integument: skin warm and dry. No rashes.   Neurologic: GCS 15, no focal deficits, symmetric strength 5/5 in the upper and lower extremities bilaterally              DIAGNOSTIC RESULTS   LABS:    Labs Reviewed   CBC WITH AUTO DIFFERENTIAL - Abnormal; Notable for the following components:       Result Value    WBC 11.7 (*)     RBC 6.21 (*)     Hemoglobin 17.9 (*)     Lymphocytes % 19 (*)     Neutrophils Absolute 8.44 (*)     All other components within normal limits   COMPREHENSIVE METABOLIC PANEL - Abnormal; Notable for the following components:    Glucose 101 (*)     All other components within normal limits   TROPONIN   MAGNESIUM    D-DIMER, QUANTITATIVE       As interpreted by me, the above displayed labs are abnormal. All other labs obtained during this visit  were within normal range or not returned as of this dictation.      EKG Interpretation  Interpreted by emergency department physician, Carolyn Slater, MD      In the ED course        RADIOLOGY:   Non-plain film images such as CT, Ultrasound and MRI are read by the radiologist. Plain radiographic images are visualized and preliminarily interpreted by the ED Provider with the below findings:    Chest x-ray shows no obvious pneumothorax or acute fracture    Interpretation per the Radiologist below, if available at the time of this note:    XR CHEST (2 VW)   Final Result   No acute process.           XR CHEST (2 VW)    Result Date: 07/22/2023  EXAMINATION: TWO XRAY VIEWS OF THE CHEST 07/22/2023 8:08 pm COMPARISON:  12/14/2015 HISTORY: ORDERING SYSTEM PROVIDED HISTORY: Chest Pain TECHNOLOGIST PROVIDED HISTORY: Reason for exam:->Chest Pain FINDINGS: The lungs are without acute focal process.  There is no effusion or pneumothorax. The cardiomediastinal silhouette is without acute process. The osseous structures are without acute process.  Degenerative changes thoracic spine     No acute process.       No results found.    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     EMERGENCY DEPARTMENT COURSE    Vitals:    Vitals:    07/22/23 1735 07/22/23 1741   BP: (!) 139/98    Pulse: 92    Resp: 18    Temp: 97.4 F (36.3 C)    TempSrc: Infrared    SpO2: 98%    Weight:  129.3 kg (285 lb)       Patient was given the following medications:  Medications   sodium chloride  0.9 % bolus 1,000 mL (0 mLs IntraVENous Stopped 07/22/23 1917)           Is this patient to be included in the SEP-1 Core Measure due to severe sepsis or septic shock?   No   Exclusion criteria - the patient is NOT to be included for SEP-1 Core Measure due to:  Infection is not suspected        Medical Decision Making/Differential Diagnosis:    CC/HPI Summary, Social Determinants of health, Records Reviewed, DDx, testing done/not done, ED Course, Reassessment, disposition considerations/shared decision making with patient, consults, disposition:      ED Course as of 07/22/23 2052   Thu Jul 22, 2023   2028 EKG demonstrates atrial fibrillation with a ventricular of 94 bpm.  No acute ST-T wave changes when compared with prior EKG on 05/06/2023.  QTc 430 MS. [VG]      ED Course User Index  [VG] Slater Carolyn, MD        Medical Decision Making  Amount and/or Complexity of Data  Reviewed  Labs: ordered.  Radiology: ordered.  ECG/medicine tests: ordered.    Risk  Prescription drug management.        Patient is a 52 year old male presenting with chest pain and shortness of breath.  At time initial evaluation patient was hypertensive but otherwise hemodynamically stable.  Differential diagnosis includes  atrial fibrillation, acute coronary syndrome, pulmonary embolism, electrolyte abnormality, pneumonia to name a few.    Patient maculas fluids initiated emergency room.    EKG shows atrial fibrillation but is rate controlled with no acute ischemic changes.  CBC shows leukocytosis of 11,700 and polycythemia with hemoglobin of 17.9.  CMP shows blood glucose 101.  D-dimer is less than 200.  Initial troponin less than 6 and reassuring.  Magnesium  is normal.  Chest x-ray shows no acute process.    On reevaluation, patient feels well.  Shared decision conversation was had with the patient regarding inpatient versus outpatient follow-up.  He is comfortable with outpatient follow-up with his cardiologist.  He was given return precautions.  At time of discharge, the patient was hemodynamically stable.    CONSULTS: (Who and What was discussed)  None        I am the Primary Clinician of Record.    FINAL IMPRESSION      1. Chest pain, unspecified type          DISPOSITION/PLAN     DISPOSITION Decision To Discharge 07/22/2023 08:43:58 PM      PATIENT REFERRED TO:  Catterlin, Richard Lee, DO  [address]  [place] [address]  [phone]    In 2 days      [facility] Emergency Department  [address]  [place]  [address]  [phone]    If symptoms worsen      DISCHARGE MEDICATIONS:  New Prescriptions    No medications on file       DISCONTINUED MEDICATIONS:  Discontinued Medications    No medications on file              Patient was seen and evaluated by myself and my attending Gladis Dolphin, MD. Assessment and Plan discussed with attending provider,  please see attestation for final plan of care.     (Please note that portions of this note were completed with a voice recognition program.  Efforts were made to edit the dictations but occasionally words are mis-transcribed.)    Dajah Fischman, MD (electronically signed)

## 2023-07-22 NOTE — Discharge Instructions (Signed)
 Return if any new or worsening symptoms.  Return if any chest pain or shortness of breath.  Follow-up with your primary care provider.

## 2023-07-23 LAB — EKG 12-LEAD
Atrial Rate: 234 {beats}/min
Q-T Interval: 344 ms
QRS Duration: 80 ms
QTc Calculation (Bazett): 430 ms
R Axis: -14 degrees
T Axis: -23 degrees
Ventricular Rate: 94 {beats}/min

## 2023-11-04 ENCOUNTER — Ambulatory Visit
Admit: 2023-11-04 | Discharge: 2023-11-04 | Payer: BLUE CROSS/BLUE SHIELD | Attending: Cardiovascular Disease | Primary: Family Medicine

## 2023-11-04 VITALS — BP 116/83 | HR 83 | Resp 18 | Ht 72.0 in | Wt 301.2 lb

## 2023-11-04 DIAGNOSIS — I4819 Other persistent atrial fibrillation: Secondary | ICD-10-CM

## 2023-11-04 NOTE — Progress Notes (Signed)
Chief Complaint   Patient presents with    Atrial Fibrillation       Patient Active Problem List    Diagnosis Date Noted    Morbid obesity 03/04/2023    Atrial fibrillation (HCC) 04/17/2022     Overview Note:     A. CHADSVASC = 1  B. DCCV 04/22/2022  C. TEE guided DCCV 03/30/2023 with ERAF 05/06/2023      Primary hypertension 03/18/2022    Mixed hyperlipidemia 11/17/2016    Restless leg        Current Outpatient Medications   Medication Sig Dispense Refill    metoprolol succinate (TOPROL XL) 50 MG extended release tablet Take 1 tablet by mouth 2 times daily 180 tablet 1    amLODIPine (NORVASC) 5 MG tablet TAKE 1 TABLET BY MOUTH EVERY DAY 90 tablet 1    ELIQUIS 5 MG TABS tablet TAKE 1 TABLET BY MOUTH TWICE A DAY 60 tablet 5    ibuprofen (ADVIL;MOTRIN) 600 MG tablet TAKE 1 TABLET BY MOUTH THREE TIMES A DAY AS NEEDED FOR PAIN 90 tablet 0     No current facility-administered medications for this visit.        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          Physical Exam   BP 116/83   Pulse 83   Resp 18   Ht 1.829 m (6')   Wt (!) 136.6 kg (301 lb 3.2 oz)   BMI 40.85 kg/m   Constitutional: Oriented to person, place, and time. Obese No distress.    Neck: No JVD present. Carotid bruit is not present.   Cardiovascular: normal rate, irregular rhythm, normal heart sounds and intact distal pulses.  No gallop and no friction rub.  No murmur heard.  Pulmonary/Chest: Effort normal and breath sounds normal.   Abdominal: Soft. Bowel sounds are normal. No distension and no mass.   Musculoskeletal: No edema   Neurological: Alert and oriented to person, place, and time.   Skin: Skin is warm and dry.   Psychiatric: Normal  mood and affect. Behavior is normal.     EKG:  atrial fibrillation, rate 83 bpm     ASSESSMENT AND PLAN:  Patient Active Problem List   Diagnosis    Restless leg    Mixed hyperlipidemia    Primary hypertension    Atrial fibrillation (HCC)    Morbid obesity     Persistent atrial fibrillation, asymptomatic, non valvular, CHADSVASC = 1  Discussion of atrial fibrillation, including symptoms, thromboembolism potential, rate vs rhythm control, and have opted for the former    Stressed compliance with NOAC    OV 1 year      [name], M.D  [facility]

## 2023-11-14 ENCOUNTER — Encounter

## 2023-11-15 MED ORDER — METOPROLOL SUCCINATE ER 50 MG PO TB24
50 | ORAL_TABLET | Freq: Two times a day (BID) | ORAL | 1 refills | Status: DC
Start: 2023-11-15 — End: 2024-08-30

## 2023-11-15 MED ORDER — ELIQUIS 5 MG PO TABS
5 | ORAL_TABLET | Freq: Two times a day (BID) | ORAL | 5 refills | Status: DC
Start: 2023-11-15 — End: 2024-05-15

## 2024-02-19 ENCOUNTER — Emergency Department: Admit: 2024-02-19 | Payer: Worker's Compensation | Primary: Family Medicine

## 2024-02-19 ENCOUNTER — Inpatient Hospital Stay
Admit: 2024-02-19 | Discharge: 2024-02-19 | Disposition: A | Discharge status: Home / Self Care | Payer: Worker's Compensation | Attending: Emergency Medicine

## 2024-02-19 DIAGNOSIS — S39012A Strain of muscle, fascia and tendon of lower back, initial encounter: Secondary | ICD-10-CM

## 2024-02-19 MED ORDER — KETOROLAC TROMETHAMINE 30 MG/ML IJ SOLN
30 | Freq: Once | INTRAMUSCULAR | Status: AC
Start: 2024-02-19 — End: 2024-02-19
  Administered 2024-02-19: 14:00:00 30 mg via INTRAMUSCULAR

## 2024-02-19 MED ORDER — PREDNISONE 10 MG PO TABS
10 | ORAL_TABLET | ORAL | 0 refills | 6.00000 days | Status: AC
Start: 2024-02-19 — End: 2024-02-29

## 2024-02-19 MED ORDER — ORPHENADRINE CITRATE 30 MG/ML IJ SOLN
30 | Freq: Once | INTRAMUSCULAR | Status: AC
Start: 2024-02-19 — End: 2024-02-19
  Administered 2024-02-19: 14:00:00 60 mg via INTRAMUSCULAR

## 2024-02-19 MED ORDER — ORPHENADRINE CITRATE ER 100 MG PO TB12
100 | Freq: Once | ORAL | Status: AC
Start: 2024-02-19 — End: 2024-02-19
  Administered 2024-02-19: 15:00:00 100 mg via ORAL

## 2024-02-19 MED FILL — KETOROLAC TROMETHAMINE 30 MG/ML IJ SOLN: 30 MG/ML | INTRAMUSCULAR | Qty: 1 | Fill #0

## 2024-02-19 MED FILL — ORPHENADRINE CITRATE 30 MG/ML IJ SOLN: 30 MG/ML | INTRAMUSCULAR | Qty: 2 | Fill #0

## 2024-02-19 MED FILL — ORPHENADRINE CITRATE ER 100 MG PO TB12: 100 MG | ORAL | Qty: 1 | Fill #0

## 2024-02-19 NOTE — ED Provider Notes (Signed)
 53 year old male presenting with low back pain.  It started suddenly at work yesterday morning.  He worked through the rest of the day but it did not go away when he got home.  Upon arrival today he has the ongoing discomfort in the middle, no specific radiation to either side.  No fevers or chills, no loss of bowel or bladder control, no saddle anesthesia, no urinary retention.         Family History   Problem Relation Age of Onset    Heart Disease Mother     High Blood Pressure Mother     Diabetes Mother     High Blood Pressure Father     Diabetes Father     Heart Disease Father      Past Surgical History:   Procedure Laterality Date    ABDOMEN SURGERY      12 inches with colostomy with reversal    APPENDECTOMY      HERNIA REPAIR  2008    OTHER SURGICAL HISTORY  09/19/2014    laparoscopic incisional hernia repair with mesh    TONSILLECTOMY         Review of Systems   Constitutional:  Negative for chills and fever.   Respiratory:  Negative for chest tightness and shortness of breath.    Musculoskeletal:  Positive for back pain.        Physical Exam  Constitutional:       General: He is not in acute distress.     Appearance: He is well-developed.   HENT:      Head: Normocephalic and atraumatic.   Eyes:      Pupils: Pupils are equal, round, and reactive to light.   Neck:      Thyroid: No thyromegaly.   Cardiovascular:      Rate and Rhythm: Normal rate and regular rhythm.      Pulses: Normal pulses.      Comments: Distal pulses intact  Pulmonary:      Effort: Pulmonary effort is normal. No respiratory distress.      Breath sounds: Normal breath sounds. No wheezing.   Abdominal:      General: There is no distension.      Palpations: Abdomen is soft. There is no mass.      Tenderness: There is no abdominal tenderness. There is no guarding or rebound.   Musculoskeletal:      Cervical back: Normal range of motion and neck supple.      Comments: Some diminished range of motion due to disc   Skin:     General: Skin is  warm and dry.      Findings: No erythema.   Neurological:      Mental Status: He is alert and oriented to person, place, and time.      Cranial Nerves: No cranial nerve deficit.   Psychiatric:         Mood and Affect: Mood normal.          Procedures     MDM       History From: Patient    CC/HPI Summary, DDx, ED Course, Reassessment, Tests Considered, Patient expectation:   Patient presents with low back pain.  He was at work lifting something heavy yesterday.  He worked all through the rest of the day.  He does not have urinary retention, no loss of bowel or bladder control, no saddle anesthesia, no neurologic deficits or fever or chills    Considered strain,  sprain, compression fracture amongst other low back issues.  X-ray shows concern for bony abnormalities, arthritis, sclerosing area of the lumbar spine.    No evidence of compression fracture.  Patient updated regarding abnormal x-ray findings.  Patient knows he needs to follow-up with family doctor.  Will be given steroids and muscle relaxers.  Show of the sprain he had from lifting.    Social Determinants affecting Dx or Tx: Stress    Chronic Conditions: Obesity, A-fib, dyslipidemia, hypertension    Records Reviewed: 12/06/2023 patient seen and nurse practitioner Rennie Casco equipment for low back pain.         --------------------------------------------- PAST HISTORY ---------------------------------------------  Past Medical History:  has a past medical history of Arthritis of right acromioclavicular joint, Diverticulitis, Hernia, Mixed hyperlipidemia, Primary hypertension, and Restless leg.    Past Surgical History:  has a past surgical history that includes hernia repair (2008); Appendectomy; Abdomen surgery; other surgical history (09/19/2014); and Tonsillectomy.    Social History:  reports that he has never smoked. He has never used smokeless tobacco. He reports that he does not drink alcohol and does not use drugs.    Family History: family history  includes Diabetes in his father and mother; Heart Disease in his father and mother; High Blood Pressure in his father and mother.     The patient's home medications have been reviewed.    Allergies: Patient has no known allergies.    -------------------------------------------------- RESULTS -------------------------------------------------  Labs:  No results found for this visit on 02/19/24.    Radiology:  XR LUMBAR SPINE (2-3 VIEWS)   Final Result   Mild multilevel degenerative changes seen throughout the lumbar spine. No   loss of height to suggest compression.  There is some sclerosis seen along   the superior endplate of L2 with no loss of height.  No signs of fracture or   traumatic malalignment.Aaron Aas             ------------------------- NURSING NOTES AND VITALS REVIEWED ---------------------------  Date / Time Roomed:  02/19/2024  9:24 AM  ED Bed Assignment:  18/18    The nursing notes within the ED encounter and vital signs as below have been reviewed.   BP (!) 128/102   Pulse 85   Temp 98 F (36.7 C) (Oral)   Resp 16   Ht 1.829 m (6')   Wt 133.8 kg (295 lb)   SpO2 98%   BMI 40.01 kg/m   Oxygen Saturation Interpretation: Normal      ------------------------------------------ PROGRESS NOTES ------------------------------------------  I have spoken with the patient and discussed today's results, in addition to providing specific details for the plan of care and counseling regarding the diagnosis and prognosis.  Their questions are answered at this time and they are agreeable with the plan. I discussed at length with them reasons for immediate return here for re evaluation. They will followup with primary care by calling their office tomorrow.      --------------------------------- ADDITIONAL PROVIDER NOTES ---------------------------------  At this time the patient is without objective evidence of an acute process requiring hospitalization or inpatient management.  They have remained hemodynamically  stable throughout their entire ED visit and are stable for discharge with outpatient follow-up.     The plan has been discussed in detail and they are aware of the specific conditions for emergent return, as well as the importance of follow-up.      Discharge Medication List as of 02/19/2024 11:12 AM  START taking these medications    Details   predniSONE (DELTASONE) 10 MG tablet Take 40mg  po qd x 5 days QS for 5 days, Disp-20 tablet, R-0Normal             Diagnosis:  1. Strain of lumbar region, initial encounter    2. Abnormal x-ray of lumbar spine    3. Arthritis of lumbar spine        Disposition:  Patient's disposition: Discharge to home  Patient's condition is stable.                      Basilio Both, DO  02/19/24 1540

## 2024-04-09 ENCOUNTER — Encounter

## 2024-04-10 MED ORDER — AMLODIPINE BESYLATE 5 MG PO TABS
5 | ORAL_TABLET | Freq: Every day | ORAL | 1 refills | 30.00000 days | Status: DC
Start: 2024-04-10 — End: 2024-09-04

## 2024-05-13 ENCOUNTER — Encounter

## 2024-05-15 MED ORDER — ELIQUIS 5 MG PO TABS
5 | ORAL_TABLET | Freq: Two times a day (BID) | ORAL | 5 refills | 30.00000 days | Status: DC
Start: 2024-05-15 — End: 2024-11-27

## 2024-08-30 MED ORDER — METOPROLOL SUCCINATE ER 50 MG PO TB24
50 | ORAL_TABLET | Freq: Two times a day (BID) | ORAL | 1 refills | Status: AC
Start: 2024-08-30 — End: ?

## 2024-09-01 ENCOUNTER — Encounter

## 2024-09-04 MED ORDER — AMLODIPINE BESYLATE 5 MG PO TABS
5 | ORAL_TABLET | Freq: Every day | ORAL | 1 refills | 90.00000 days | Status: DC
Start: 2024-09-04 — End: 2024-09-06

## 2024-09-05 ENCOUNTER — Emergency Department: Admit: 2024-09-06 | Payer: BLUE CROSS/BLUE SHIELD | Primary: Family Medicine

## 2024-09-05 DIAGNOSIS — R0789 Other chest pain: Secondary | ICD-10-CM

## 2024-09-05 DIAGNOSIS — R079 Chest pain, unspecified: Principal | ICD-10-CM

## 2024-09-05 DIAGNOSIS — B348 Other viral infections of unspecified site: Secondary | ICD-10-CM

## 2024-09-05 NOTE — Telephone Encounter (Signed)
"  Patient complains of flushed, dizziness and nauseated and one episode of chest tightness. BP is running 151/110. Please advise.  "

## 2024-09-05 NOTE — Telephone Encounter (Signed)
"  Patient notified and said he will go to the ER now.  "

## 2024-09-05 NOTE — H&P (Addendum)
 "Department of Internal Medicine  History and Physical    PCP: Caleb Charlie Ruth, DO  Admitting Physician: Dr. Ladean  Consultants:   Date of Service: 09/05/2024    CHIEF COMPLAINT:  chest pain    HISTORY OF PRESENT ILLNESS:    Patient is 53 year old male who presented to the ED due to chest pain.  States that his started yesterday with its first episode.  States he was out working when he began to feel lightheaded.  He left work and went back home and that is when he started experiencing sharp chest pain.  Today states that it was pressure-like in sensation in the substernal area.  As such she presented to the ED for further evaluation management.  He has been feeling more short of breath as well.  He does have cough but he denies any wheezing.  Denies any subjective fever or chills.  He has not been diagnosed with COPD or asthma.  However he has been exposed to secondhand smoke for several years.  He states his mother did have heart disease and needed bypass in her 58s to 51s.    Chest pain    PAST MEDICAL Hx:  Past Medical History:   Diagnosis Date    Arthritis of right acromioclavicular joint 01/04/2015    Diverticulitis     Hernia     Mixed hyperlipidemia 11/17/2016    Primary hypertension 03/18/2022    Restless leg        PAST SURGICAL Hx:   Past Surgical History:   Procedure Laterality Date    ABDOMEN SURGERY      12 inches with colostomy with reversal    APPENDECTOMY      HERNIA REPAIR  2008    OTHER SURGICAL HISTORY  09/19/2014    laparoscopic incisional hernia repair with mesh    TONSILLECTOMY         FAMILY Hx:  Family History   Problem Relation Age of Onset    Heart Disease Mother     High Blood Pressure Mother     Diabetes Mother     High Blood Pressure Father     Diabetes Father     Heart Disease Father        HOME MEDICATIONS:  Prior to Admission medications   Medication Sig Start Date End Date Taking? Authorizing Provider   amLODIPine  (NORVASC ) 5 MG tablet TAKE 1 TABLET BY MOUTH EVERY DAY  09/04/24   Hunt, Robert E, MD   metoprolol  succinate (TOPROL  XL) 50 MG extended release tablet TAKE 1 TABLET BY MOUTH TWICE A DAY 08/30/24   Perley Lamar BRAVO, MD   ELIQUIS  5 MG TABS tablet TAKE 1 TABLET BY MOUTH TWICE A DAY 05/15/24   Perley Lamar BRAVO, MD   ibuprofen  (ADVIL ;MOTRIN ) 600 MG tablet TAKE 1 TABLET BY MOUTH THREE TIMES A DAY AS NEEDED FOR PAIN 03/15/23   Catterlin, Charlie Ruth, DO       ALLERGIES:  Patient has no known allergies.    SOCIAL Hx:  Social History     Socioeconomic History    Marital status: Divorced     Spouse name: Not on file    Number of children: Not on file    Years of education: Not on file    Highest education level: Not on file   Occupational History    Not on file   Tobacco Use    Smoking status: Never    Smokeless tobacco: Never   Vaping Use  Vaping status: Never Used   Substance and Sexual Activity    Alcohol use: Never     Comment: social    Drug use: No    Sexual activity: Yes     Partners: Female   Other Topics Concern    Not on file   Social History Narrative    Not on file     Social Drivers of Health     Financial Resource Strain: Low Risk  (02/24/2022)    Overall Financial Resource Strain (CARDIA)     Difficulty of Paying Living Expenses: Not hard at all   Food Insecurity: Not on file (02/24/2022)   Transportation Needs: Unknown (02/24/2022)    PRAPARE - Therapist, Art (Medical): Not on file     Lack of Transportation (Non-Medical): No   Physical Activity: Not on file   Stress: Not on file   Social Connections: Not on file   Intimate Partner Violence: Not on file   Housing Stability: Unknown (02/24/2022)    Housing Stability Vital Sign     Unable to Pay for Housing in the Last Year: Not on file     Number of Places Lived in the Last Year: Not on file     Unstable Housing in the Last Year: No       ROS: Positive in bold  General:   Denies chills, fatigue, fever, malaise, night sweats or weight loss    Psychological:   Denies anxiety, disorientation or  hallucinations    ENT:    Denies epistaxis, headaches, vertigo or visual changes    Cardiovascular:   Denies any chest pain, irregular heartbeats, or palpitations. No paroxysmal nocturnal dyspnea.    Respiratory:   Denies shortness of breath, coughing, sputum production, hemoptysis, or wheezing.  No orthopnea.    Gastrointestinal:   Denies nausea, vomiting, diarrhea, or constipation.  Denies any abdominal pain.  Denies change in bowel habits or stools.      Genito-Urinary:    Denies any urgency, frequency, hematuria.  Voiding without difficulty.    Musculoskeletal:   Denies joint pain, joint stiffness, joint swelling or muscle pain    Neurology:    Denies any headache or focal neurological deficits. No weakness or paresthesia.    Derm:    Denies any rashes, ulcers, or excoriations.  Denies bruising.      Extremities:   Denies any lower extremity swelling or edema.      PHYSICAL EXAM: Abnormal findings noted  VITALS:  Vitals:    09/06/24 0130   BP: 127/88   Pulse: 100   Resp:    Temp:    SpO2: 96%         CONSTITUTIONAL:    Awake, alert, cooperative, no apparent distress, and appears stated age    EYES:    PERRL, EOMI, sclera clear, conjunctiva normal    ENT:    Normocephalic, atraumatic, sinuses nontender on palpation. External ears without lesions. Oral pharynx with moist mucus membranes.  Tonsils without erythema or exudates.    NECK:    Supple, symmetrical, trachea midline, no adenopathy, thyroid symmetric, not enlarged and no tenderness, skin normal, no bruits, no JVD    HEMATOLOGIC/LYMPHATICS:    No cervical lymphadenopathy and no supraclavicular lymphadenopathy    LUNGS:    Symmetric. No increased work of breathing, good air exchange, clear to auscultation bilaterally, no wheezes, rhonchi, or rales,     CARDIOVASCULAR:    Normal apical impulse, regular  rate and rhythm, normal S1 and S2, no S3 or S4, and no murmur noted    ABDOMEN:    No scars, normal bowel sounds, soft, non-distended, non-tender, no masses  palpated, no hepatosplenomegaly, no rebound or guarding elicited on palpation     MUSCULOSKELETAL:    There is no redness, warmth, or swelling of the joints.  Full range of motion noted.  Motor strength is 5 out of 5 all extremities bilaterally.  Tone is normal.    NEUROLOGIC:    Awake, alert, oriented to name, place and time.  Cranial nerves II-XII are grossly intact.  Motor is 5 out of 5 bilaterally.      SKIN:    No bruising or bleeding.  No redness, warmth, or swelling    EXTREMITIES:    Peripheral pulses present.  No edema, cyanosis, or swelling.        LINES/CATHETERS     LABORATORY DATA:  CBC with Differential:    Lab Results   Component Value Date/Time    WBC 13.4 09/05/2024 08:45 PM    RBC 6.47 09/05/2024 08:45 PM    HGB 19.0 09/05/2024 08:45 PM    HCT 56.1 09/05/2024 08:45 PM    PLT 298 09/05/2024 08:45 PM    MCV 86.7 09/05/2024 08:45 PM    MCH 29.4 09/05/2024 08:45 PM    MCHC 33.9 09/05/2024 08:45 PM    RDW 14.1 09/05/2024 08:45 PM    LYMPHOPCT 12 09/05/2024 08:45 PM    MONOPCT 6 09/05/2024 08:45 PM    EOSPCT 0 09/05/2024 08:45 PM    BASOPCT 0 09/05/2024 08:45 PM    MONOSABS 0.79 09/05/2024 08:45 PM    LYMPHSABS 1.57 09/05/2024 08:45 PM    EOSABS 0.04 09/05/2024 08:45 PM    BASOSABS 0.04 09/05/2024 08:45 PM     CMP:    Lab Results   Component Value Date/Time    NA 139 09/05/2024 08:45 PM    K 3.8 09/05/2024 08:45 PM    CL 103 09/05/2024 08:45 PM    CO2 24 09/05/2024 08:45 PM    BUN 12 09/05/2024 08:45 PM    CREATININE 1.0 09/05/2024 08:45 PM    GFRAA >60 07/26/2019 09:03 AM    LABGLOM 86 09/05/2024 08:45 PM    LABGLOM >60 02/25/2022 01:48 PM    GLUCOSE 205 09/05/2024 08:45 PM    CALCIUM  9.5 09/05/2024 08:45 PM    BILITOT 0.5 07/22/2023 07:05 PM    ALKPHOS 80 07/22/2023 07:05 PM    AST 25 07/22/2023 07:05 PM    ALT 36 07/22/2023 07:05 PM       ASSESSMENT/PLAN:  Chest pain   Elevated CBC levels  COPD  Atrial fibrillation  Hyperlipidemia  Hypertension  RLS        Patient presented to the ED due to chest  pain.  He has been experiencing lightheadedness as well.  On exam he does not complain of chest pain  In the ED.  Appears to worsen with activity and improves with rest.  Denies any previous history of stress test.  He does have family history of heart disease history involving his mother who had a bypass in her 68s or 60s.'s stress test will be ordered.  Cardiology will be consulted.  Follows with Dr. Perley for his atrial fibrillation.        Cagney Degrace U Shaketa Serafin, [place]  4:24 AM  09/06/2024    Electronically signed by Jackolyn RAYMOND Manly, DO on 09/06/24 at 4:24  AM EST     "

## 2024-09-05 NOTE — ED Provider Notes (Signed)
 "         [facility] EMERGENCY DEPARTMENT  EMERGENCY DEPARTMENT ENCOUNTER        Pt Name: Jay Maxwell  MRN: [id number]  Birthdate 1971/09/28  Date of evaluation: 09/05/2024  Provider: Ozell Hitchcock, DO  PCP: Catterlin, Richard Lee, DO  Note Started: 8:44 PM EST 09/05/24    CHIEF COMPLAINT       Chief Complaint   Patient presents with    Hypertension     Patient stated his BP was 165/115 at home yesterday and he experienced chest tightness then. Reached out to Dr and Dr stated to come to the ED to be checked out.        HISTORY OF PRESENT ILLNESS: 1 or more Elements   History From: patient    Limitations to history : None    Jay Maxwell is a 53 y.o. male who presents to the ED for evaluation of elevated blood pressure and chest pain.  Patient states that for the past couple days his blood pressure has been high.  He admits that he had been recently out of his blood pressure medication for a few days.  Patient is on amlodipine  and metoprolol .  He did contact his cardiologist and they advised him to come in to be further evaluated.  States that yesterday he had heaviness and tightness in his chest.  He states now it is just a heaviness and not as bad as yesterday.  He also had some sweatiness and nausea with this.  Patient does have a history of A-fib and is on anticoagulants.  History of high blood pressure and hypercholesteremia.  Non-smoker.  Denies any recent travel or hospitalization.  Denies swelling in his lower extremities.  No history of blood clots.    Nursing Notes were all reviewed and agreed with or any disagreements were addressed in the HPI.      REVIEW OF EXTERNAL NOTE :    03/30/23    TEE W/ POSSIBLE CARDIOVERSION (PRN CONTRAST/BUBBLE/3D) 03/30/2023 10:18 AM (Final)    Interpretation Summary    Left Ventricle: Normal left ventricular systolic function. EF by visual approximation is > 50%. Left ventricle size is normal.    Right Ventricle: Right ventricle size is normal.  Normal systolic function.    Left Atrium: Left atrium is dilated. No left atrial appendage thrombus noted.    Interatrial Septum: No evidence of right to left interatrial shunting on bubble study.    Mitral Valve: Mild regurgitation. No stenosis noted.    Tricuspid Valve: Mild regurgitation. RV-RA gradient is estimated at 16 mmHg.    Cardioversion: Successful DC CVN to sinus rhythm on 2nd attempt.    Signed by: Norleen JONETTA Rover, MD on 03/30/2023 10:18 AM      REVIEW OF SYSTEMS :           Positives and Pertinent negatives as per HPI.     SURGICAL HISTORY     Past Surgical History:   Procedure Laterality Date    ABDOMEN SURGERY      12 inches with colostomy with reversal    APPENDECTOMY      HERNIA REPAIR  2008    OTHER SURGICAL HISTORY  09/19/2014    laparoscopic incisional hernia repair with mesh    TONSILLECTOMY         CURRENTMEDICATIONS       Previous Medications    AMLODIPINE  (NORVASC ) 5 MG TABLET    TAKE 1 TABLET  BY MOUTH EVERY DAY    ELIQUIS  5 MG TABS TABLET    TAKE 1 TABLET BY MOUTH TWICE A DAY    IBUPROFEN  (ADVIL ;MOTRIN ) 600 MG TABLET    TAKE 1 TABLET BY MOUTH THREE TIMES A DAY AS NEEDED FOR PAIN    METOPROLOL  SUCCINATE (TOPROL  XL) 50 MG EXTENDED RELEASE TABLET    TAKE 1 TABLET BY MOUTH TWICE A DAY       ALLERGIES     Patient has no known allergies.    FAMILYHISTORY       Family History   Problem Relation Age of Onset    Heart Disease Mother     High Blood Pressure Mother     Diabetes Mother     High Blood Pressure Father     Diabetes Father     Heart Disease Father         SOCIAL HISTORY       Social History     Tobacco Use    Smoking status: Never    Smokeless tobacco: Never   Vaping Use    Vaping status: Never Used   Substance Use Topics    Alcohol use: No     Comment: social    Drug use: No       SCREENINGS        Glasgow Coma Scale  Eye Opening: Spontaneous  Best Verbal Response: Oriented  Best Motor Response: Obeys commands  Glasgow Coma Scale Score: 15                CIWA Assessment  BP: 112/80  Pulse:  99           PHYSICAL EXAM  1 or more Elements     ED Triage Vitals   BP Girls Systolic BP Percentile Girls Diastolic BP Percentile Boys Systolic BP Percentile Boys Diastolic BP Percentile Temp Temp Source Pulse   09/05/24 2022 -- -- -- -- 09/05/24 2022 09/05/24 2022 09/05/24 2022   125/88     97.2 F (36.2 C) Infrared 94      Respirations SpO2 Height Weight - Scale       09/05/24 2038 09/05/24 2022 09/05/24 2038 09/05/24 2038       16 95 % 1.829 m (6') 131.5 kg (290 lb)           Constitutional/General: Alert and oriented x3  Head: Normocephalic   Eyes: PERRL, EOMI, conjunctiva normal, sclera non icteric  ENT:  Oropharynx clear, handling secretions, no trismus,   Neck: Supple, full ROM, no stridor  Respiratory: Lungs clear to auscultation bilaterally, no wheezes, rales, or rhonchi. Not in respiratory distress  Cardiovascular:  Regular rate.  Irregular rhythm. No murmurs, no gallops, no rubs. 2+ distal pulses. Equal extremity pulses.   Chest: No chest wall tenderness  GI:  Abdomen Soft, Non tender, Non distended.  No rebound, guarding, or rigidity.   Musculoskeletal: Moves all extremities x 4. Warm and well perfused, no clubbing, no cyanosis, no pretibial edema. Capillary refill <3 seconds  Integument: skin warm and dry. No rashes.  No diaphoresis or pallor  Neurologic: GCS 15, no focal deficits, symmetric strength 5/5 in the upper and lower extremities bilaterally  Psychiatric: Normal Affect            DIAGNOSTIC RESULTS   LABS:    Labs Reviewed   BASIC METABOLIC PANEL - Abnormal; Notable for the following components:       Result Value    Glucose 205 (*)  All other components within normal limits   CBC WITH AUTO DIFFERENTIAL - Abnormal; Notable for the following components:    WBC 13.4 (*)     RBC 6.47 (*)     Hemoglobin 19.0 (*)     Hematocrit 56.1 (*)     Neutrophils % 81 (*)     Lymphocytes % 12 (*)     Neutrophils Absolute 10.88 (*)     Eosinophils Absolute 0.04 (*)     All other components within normal  limits   TROPONIN   TROPONIN       As interpreted by me, the above displayed labs are abnormal. All other labs obtained during this visit were within normal range or not returned as of this dictation.      EKG Interpretation  Interpreted by emergency department physician, Ozell Hitchcock, DO    Rhythm: atrial fibrillation - controlled  Rate: normal  Axis: normal  Ectopy: none  Conduction: normal  ST Segments: normal  T Waves: normal  Q Waves: none    Clinical Impression: atrial fibrillation (chronic)    Ozell Hitchcock, DO          RADIOLOGY:   Non-plain film images such as CT, Ultrasound and MRI are read by the radiologist. Plain radiographic images are visualized and preliminarily interpreted by the ED Provider with the below findings:    Interpretation per the Radiologist below, if available at the time of this note:    XR CHEST (2 VW)   Final Result   No acute process.           No results found.    No results found.    PROCEDURES   Unless otherwise noted below, none          CRITICAL CARE TIME (.cct)   NA    PAST MEDICAL HISTORY/Chronic Conditions Affecting Care      has a past medical history of Arthritis of right acromioclavicular joint (01/04/2015), Diverticulitis, Hernia, Mixed hyperlipidemia (11/17/2016), Primary hypertension (03/18/2022), and Restless leg.     EMERGENCY DEPARTMENT COURSE    Vitals:    Vitals:    09/05/24 2038 09/05/24 2137 09/05/24 2151 09/05/24 2220   BP:  127/86 126/84 112/80   Pulse:  (!) 101 94 99   Resp: 16 16 20 16    Temp:       TempSrc:       SpO2:  98% 95% 95%   Weight: 131.5 kg (290 lb)      Height: 1.829 m (6')          Patient was given the following medications:  Medications   nitroGLYCERIN  (NITROSTAT ) SL tablet 0.4 mg (0.4 mg SubLINGual Given 09/05/24 2137)   sodium chloride  0.9 % bolus 1,000 mL (1,000 mLs IntraVENous Bolus from Bag 09/05/24 2229)   aspirin  tablet 325 mg (325 mg Oral Given 09/05/24 2137)         Medical Decision Making/Differential Diagnosis:    CC/HPI  Summary, Social Determinants of health, Records Reviewed, DDx, testing done/not done, ED Course, Reassessment, disposition considerations/shared decision making with patient, consults, disposition:      ED Course as of 09/05/24 2236   Tue Sep 05, 2024   2132 Patient's initial troponin was 8.  Previous value was less than 6.  Will repeat a troponin for a delta. [MS]   2159 Patient became diaphoretic after the slowing of nitroglycerin .  IV fluids ordered and will have nurse repeat EKG. [MS]   2225 Patient is  now chest pain free after the summer going to glycerin .  He is agreeable to admission for further treatment evaluation. [MS]   2232 Spoke with Dr. Husain (Medicine).  Discussed case.  He will admit this patient   [MS]      ED Course User Index  [MS] Fredric Sharper, DO        Medical Decision Making  Amount and/or Complexity of Data Reviewed  Labs: ordered.  Radiology: ordered.  ECG/medicine tests: ordered.    Risk  OTC drugs.  Prescription drug management.  Decision regarding hospitalization.        Patient presents to the ED for evaluation of elevated blood pressure and chest pain.  Patient states that for the past couple days his blood pressure has been high.  He admits that he had been recently out of his blood pressure medication for a few days.  Patient is on amlodipine  and metoprolol . Differential diagnoses included but not limited to anginal equivalent, musculoskeletal pain, hypertensive urgency, benign essential hypertension. Workup in the ED consisted appropriate labs and imaging.  The following labs were evaluated and interpreted by myself.  CBC shows mild leukocytosis with mild left shift.  No evidence of anemia.  Hemoglobin is 19 which could represent hemoconcentration.  BMP shows no electrolyte abnormalities or evidence of renal sufficiency.  Troponin was 8.  Repeat troponin obtained and is 7.  Chest x-ray interpreted by the radiologist as showing no acute cardiopulmonary disease. Patient was given  aspirin  and sublingual which was for their symptoms with good improvement. Patient requires continued workup and management of their symptoms and will be admitted to the hospital for further evaluation and treatment.  Discussed case with medicine who agreed to admit patient to medical service.      CONSULTS: (Who and What was discussed)  Medicine      I am the Primary Clinician of Record.    FINAL IMPRESSION      1. Chest pain, unspecified type          DISPOSITION/PLAN     DISPOSITION Decision To Admit 09/05/2024 10:34:32 PM      PATIENT REFERRED TO:  No follow-up provider specified.    DISCHARGE MEDICATIONS:  New Prescriptions    No medications on file       DISCONTINUED MEDICATIONS:  Discontinued Medications    No medications on file              (Please note that portions of this note were completed with a voice recognition program.  Efforts were made to edit the dictations but occasionally words are mis-transcribed.)    Sharper Fredric, DO (electronically signed)           Fredric Sharper, DO  09/05/24 2236    "

## 2024-09-06 ENCOUNTER — Inpatient Hospital Stay: Admit: 2024-09-06 | Payer: BLUE CROSS/BLUE SHIELD | Primary: Family Medicine

## 2024-09-06 ENCOUNTER — Inpatient Hospital Stay
Admission: EM | Admit: 2024-09-06 | Discharge: 2024-09-06 | Disposition: A | Discharge status: Home / Self Care | Payer: BLUE CROSS/BLUE SHIELD | Admitting: Internal Medicine

## 2024-09-06 LAB — ECHO (TTE) LIMITED (PRN CONTRAST/BUBBLE/STRAIN/3D)
AV Area by Peak Velocity: 3.3 cm2
AV Area by VTI: 4.1 cm2
AV Mean Gradient: 2 mmHg
AV Mean Velocity: 0.8 m/s
AV Peak Gradient: 4 mmHg
AV Peak Velocity: 1 m/s
AV VTI: 15.2 cm
AV Velocity Ratio: 0.8
AVA/BSA Peak Velocity: 1.3 cm2/m2
AVA/BSA VTI: 1.6 cm2/m2
Ao Root Index: 1.61 cm/m2
Aortic Root: 4 cm
Ascending Aorta Index: 1.65 cm/m2
Ascending Aorta: 4.1 cm
Body Surface Area: 2.58 m2
EF BP: 64 % (ref 55–100)
EF Physician: 60 %
Fractional Shortening 2D: 30 % (ref 28–44)
IVSd: 1.2 cm — AB (ref 0.6–1.0)
LA Diameter: 4.4 cm
LA Size Index: 1.77 cm/m2
LA Volume A-L A4C: 55 mL (ref 18–58)
LA Volume Index A-L A4C: 22 mL/m2 (ref 16–34)
LA Volume Index MOD A4C: 21 mL/m2 (ref 16–34)
LA Volume MOD A4C: 52 mL (ref 18–58)
LA/AO Root Ratio: 1.1
LV EDV A2C: 99 mL
LV EDV A4C: 91 mL
LV EDV BP: 96 mL (ref 67–155)
LV EDV Index A2C: 40 mL/m2
LV EDV Index A4C: 37 mL/m2
LV EDV Index BP: 39 mL/m2
LV ESV A2C: 24 mL
LV ESV A4C: 47 mL
LV ESV BP: 35 mL (ref 22–58)
LV ESV Index A2C: 10 mL/m2
LV ESV Index A4C: 19 mL/m2
LV ESV Index BP: 14 mL/m2
LV Ejection Fraction A2C: 76 %
LV Ejection Fraction A4C: 49 %
LV Mass 2D Index: 66.4 g/m2 (ref 49–115)
LV Mass 2D: 165.5 g (ref 88–224)
LV RWT Ratio: 0.6
LVIDd Index: 1.61 cm/m2
LVIDd: 4 cm — AB (ref 4.2–5.9)
LVIDs Index: 1.12 cm/m2
LVIDs: 2.8 cm
LVOT Area: 4.2 cm2
LVOT Diameter: 2.3 cm
LVOT Mean Gradient: 1 mmHg
LVOT Peak Gradient: 2 mmHg
LVOT Peak Velocity: 0.8 m/s
LVOT SV: 62.3 mL
LVOT Stroke Volume Index: 24.8 mL/m2
LVOT VTI: 14.9 cm
LVOT:AV VTI Index: 0.98
LVPWd: 1.2 cm — AB (ref 0.6–1.0)
MV A Velocity: 0.34 m/s
MV Area by PHT: 5.8 cm2
MV Area by VTI: 3.7 cm2
MV E Velocity: 0.7 m/s
MV E Wave Deceleration Time: 172.4 ms
MV E/A: 2.06
MV Max Velocity: 1 m/s
MV Mean Gradient: 1 mmHg
MV Mean Velocity: 0.6 m/s
MV PHT: 38.2 ms
MV Peak Gradient: 4 mmHg
MV VTI: 16.8 cm
MV:LVOT VTI Index: 1.13
RVIDd: 3.1 cm

## 2024-09-06 LAB — RESPIRATORY PANEL, MOLECULAR, WITH COVID-19
Adenovirus PCR: NOT DETECTED
B Pertussis by PCR: NOT DETECTED
Bordetella parapertussis by PCR: NOT DETECTED
Chlamydia pneumoniae By PCR: NOT DETECTED
Coronavirus 229E PCR: NOT DETECTED
Coronavirus HKU1 PCR: NOT DETECTED
Coronavirus NL63 PCR: NOT DETECTED
Coronavirus OC43 PCR: NOT DETECTED
Human Metapneumovirus PCR: NOT DETECTED
Influenza A by PCR: NOT DETECTED
Influenza B by PCR: NOT DETECTED
Mycoplasma pneumo by PCR: NOT DETECTED
Parainfluenza 1 PCR: NOT DETECTED
Parainfluenza 2 PCR: NOT DETECTED
Parainfluenza 3 PCR: NOT DETECTED
Parainfluenza 4 PCR: NOT DETECTED
Resp Syncytial Virus PCR: NOT DETECTED
Rhino/Enterovirus PCR: DETECTED — AB
SARS-CoV-2, PCR: NOT DETECTED

## 2024-09-06 LAB — BASIC METABOLIC PANEL
Anion Gap: 12 mmol/L (ref 7–16)
BUN: 12 mg/dL (ref 6–20)
CO2: 24 mmol/L (ref 22–29)
Calcium: 9.5 mg/dL (ref 8.6–10.0)
Chloride: 103 mmol/L (ref 98–107)
Creatinine: 1 mg/dL (ref 0.7–1.2)
Est, Glom Filt Rate: 86 mL/min/1.73m2 (ref >60)
Glucose: 205 mg/dL — ABNORMAL HIGH (ref 74–99)
Potassium: 3.8 mmol/L (ref 3.5–5.1)
Sodium: 139 mmol/L (ref 136–145)

## 2024-09-06 LAB — CBC WITH AUTO DIFFERENTIAL
Basophils %: 0 % (ref 0.0–2.0)
Basophils %: 0 % (ref 0.0–2.0)
Basophils Absolute: 0.04 k/uL (ref 0.00–0.20)
Basophils Absolute: 0.02 k/uL (ref 0.00–0.20)
Eosinophils %: 0 % (ref 0–6)
Eosinophils %: 1 % (ref 0–6)
Eosinophils Absolute: 0.04 k/uL — ABNORMAL LOW (ref 0.05–0.50)
Eosinophils Absolute: 0.05 k/uL (ref 0.05–0.50)
Hematocrit: 56.1 % — ABNORMAL HIGH (ref 37.0–54.0)
Hematocrit: 50.6 % (ref 37.0–54.0)
Hemoglobin: 19 g/dL — ABNORMAL HIGH (ref 12.5–16.5)
Hemoglobin: 16.9 g/dL — ABNORMAL HIGH (ref 12.5–16.5)
Immature Granulocytes %: 1 % (ref 0.0–5.0)
Immature Granulocytes %: 1 % (ref 0.0–5.0)
Immature Granulocytes Absolute: 0.09 k/uL (ref 0.00–0.58)
Immature Granulocytes Absolute: 0.07 k/uL (ref 0.00–0.58)
Lymphocytes %: 12 % — ABNORMAL LOW (ref 20.0–42.0)
Lymphocytes %: 15 % — ABNORMAL LOW (ref 20.0–42.0)
Lymphocytes Absolute: 1.57 k/uL (ref 1.50–4.00)
Lymphocytes Absolute: 1.43 k/uL — ABNORMAL LOW (ref 1.50–4.00)
MCH: 29.4 pg (ref 26.0–35.0)
MCH: 29.3 pg (ref 26.0–35.0)
MCHC: 33.9 g/dL (ref 32.0–34.5)
MCHC: 33.4 g/dL (ref 32.0–34.5)
MCV: 86.7 fL (ref 80.0–99.9)
MCV: 87.7 fL (ref 80.0–99.9)
MPV: 9.2 fL (ref 7.0–12.0)
MPV: 9.2 fL (ref 7.0–12.0)
Monocytes %: 6 % (ref 2.0–12.0)
Monocytes %: 10 % (ref 2.0–12.0)
Monocytes Absolute: 0.79 k/uL (ref 0.10–0.95)
Monocytes Absolute: 0.93 k/uL (ref 0.10–0.95)
Neutrophils %: 81 % — ABNORMAL HIGH (ref 43.0–80.0)
Neutrophils %: 74 % (ref 43.0–80.0)
Neutrophils Absolute: 10.88 k/uL — ABNORMAL HIGH (ref 1.80–7.30)
Neutrophils Absolute: 7.05 k/uL (ref 1.80–7.30)
Platelets: 298 k/uL (ref 130–450)
Platelets: 235 k/uL (ref 130–450)
RBC: 6.47 m/uL — ABNORMAL HIGH (ref 3.80–5.80)
RBC: 5.77 m/uL (ref 3.80–5.80)
RDW: 14.1 % (ref 11.5–15.0)
RDW: 14.3 % (ref 11.5–15.0)
WBC: 13.4 k/uL — ABNORMAL HIGH (ref 4.5–11.5)
WBC: 9.6 k/uL (ref 4.5–11.5)

## 2024-09-06 LAB — COMPREHENSIVE METABOLIC PANEL
ALT: 41 U/L (ref 0–50)
AST: 22 U/L (ref 0–50)
Albumin: 3.4 g/dL — ABNORMAL LOW (ref 3.5–5.2)
Alkaline Phosphatase: 51 U/L (ref 40–129)
Anion Gap: 10 mmol/L (ref 7–16)
BUN: 13 mg/dL (ref 6–20)
CO2: 22 mmol/L (ref 22–29)
Calcium: 9.1 mg/dL (ref 8.6–10.0)
Chloride: 109 mmol/L — ABNORMAL HIGH (ref 98–107)
Creatinine: 1 mg/dL (ref 0.7–1.2)
Est, Glom Filt Rate: >90 mL/min/1.73m2 (ref >60)
Glucose: 101 mg/dL — ABNORMAL HIGH (ref 74–99)
Potassium: 3.9 mmol/L (ref 3.5–5.1)
Sodium: 141 mmol/L (ref 136–145)
Total Bilirubin: 0.5 mg/dL (ref 0.0–1.2)
Total Protein: 6.1 g/dL — ABNORMAL LOW (ref 6.4–8.3)

## 2024-09-06 LAB — LIPID PANEL
Cholesterol, Total: 196 mg/dL (ref <200)
HDL: 30 mg/dL — ABNORMAL LOW (ref >40)
LDL Cholesterol: 147 mg/dL — ABNORMAL HIGH (ref <100)
Triglycerides: 97 mg/dL (ref <150)
VLDL: 19 mg/dL

## 2024-09-06 LAB — TROPONIN
Troponin, High Sensitivity: 8 ng/L (ref 0–22)
Troponin, High Sensitivity: 7 ng/L (ref 0–22)

## 2024-09-06 LAB — HEMOGLOBIN A1C: Hemoglobin A1C: 6.4 % — ABNORMAL HIGH (ref 4.0–5.6)

## 2024-09-06 LAB — MAGNESIUM: Magnesium: 2.1 mg/dL (ref 1.6–2.6)

## 2024-09-06 LAB — PROCALCITONIN: Procalcitonin: 0.12 ng/mL — ABNORMAL HIGH (ref 0.00–0.08)

## 2024-09-06 LAB — TSH: TSH: 0.65 u[IU]/mL (ref 0.27–4.20)

## 2024-09-06 LAB — PHOSPHORUS: Phosphorus: 3.9 mg/dL (ref 2.5–4.5)

## 2024-09-06 LAB — T4, FREE: T4 Free: 1.4 ng/dL (ref 0.9–1.7)

## 2024-09-06 MED ORDER — POTASSIUM CHLORIDE 10 MEQ/100ML IV SOLN
10 | INTRAVENOUS | Status: DC | PRN
Start: 2024-09-06 — End: 2024-09-06

## 2024-09-06 MED ORDER — ACETAMINOPHEN 650 MG RE SUPP
650 | Freq: Four times a day (QID) | RECTAL | Status: DC | PRN
Start: 2024-09-06 — End: 2024-09-06

## 2024-09-06 MED ORDER — DEXTROSE 10 % IV BOLUS
INTRAVENOUS | Status: DC | PRN
Start: 2024-09-06 — End: 2024-09-06

## 2024-09-06 MED ORDER — SODIUM CHLORIDE 0.9 % IV BOLUS
0.9 | Freq: Once | INTRAVENOUS | Status: DC
Start: 2024-09-06 — End: 2024-09-06

## 2024-09-06 MED ORDER — AMLODIPINE BESYLATE 10 MG PO TABS
10 | Freq: Every day | ORAL | Status: DC
Start: 2024-09-06 — End: 2024-09-06

## 2024-09-06 MED ORDER — ENOXAPARIN SODIUM 30 MG/0.3ML IJ SOSY
30 | Freq: Two times a day (BID) | INTRAMUSCULAR | Status: DC
Start: 2024-09-06 — End: 2024-09-05

## 2024-09-06 MED ORDER — POTASSIUM CHLORIDE CRYS ER 20 MEQ PO TBCR
20 | ORAL | Status: DC | PRN
Start: 2024-09-06 — End: 2024-09-06

## 2024-09-06 MED ORDER — ACETAMINOPHEN 325 MG PO TABS
325 | Freq: Four times a day (QID) | ORAL | Status: DC | PRN
Start: 2024-09-06 — End: 2024-09-06

## 2024-09-06 MED ORDER — NITROGLYCERIN 0.4 MG SL SUBL
0.4 | SUBLINGUAL | Status: DC | PRN
Start: 2024-09-06 — End: 2024-09-06
  Administered 2024-09-06: 03:00:00 0.4 mg via SUBLINGUAL

## 2024-09-06 MED ORDER — DEXTROSE 10 % IV SOLN
10 | INTRAVENOUS | Status: DC | PRN
Start: 2024-09-06 — End: 2024-09-06

## 2024-09-06 MED ORDER — NORMAL SALINE FLUSH 0.9 % IV SOLN
0.9 | Freq: Two times a day (BID) | INTRAVENOUS | Status: DC
Start: 2024-09-06 — End: 2024-09-06
  Administered 2024-09-06 (×2): 10 mL via INTRAVENOUS

## 2024-09-06 MED ORDER — SULFUR HEXAFLUORIDE MICROSPH 60.7-25 MG IJ SUSR
60.7-25 | Freq: Once | INTRAMUSCULAR | Status: DC | PRN
Start: 2024-09-06 — End: 2024-09-06

## 2024-09-06 MED ORDER — ROSUVASTATIN CALCIUM 10 MG PO TABS
10 | ORAL_TABLET | Freq: Every evening | ORAL | 3 refills | 90.00000 days | Status: DC
Start: 2024-09-06 — End: 2024-09-11

## 2024-09-06 MED ORDER — GLUCOSE 4 G PO CHEW
4 | ORAL | Status: DC | PRN
Start: 2024-09-06 — End: 2024-09-06

## 2024-09-06 MED ORDER — METOPROLOL SUCCINATE ER 50 MG PO TB24
50 | Freq: Two times a day (BID) | ORAL | Status: DC
Start: 2024-09-06 — End: 2024-09-06
  Administered 2024-09-06 (×2): 50 mg via ORAL

## 2024-09-06 MED ORDER — APIXABAN 5 MG PO TABS
5 | Freq: Two times a day (BID) | ORAL | Status: DC
Start: 2024-09-06 — End: 2024-09-06
  Administered 2024-09-06 (×2): 5 mg via ORAL

## 2024-09-06 MED ORDER — GLUCAGON HCL (DIAGNOSTIC) 1 MG IJ SOLR
1 | INTRAMUSCULAR | Status: DC | PRN
Start: 2024-09-06 — End: 2024-09-06

## 2024-09-06 MED ORDER — POTASSIUM BICARB-CITRIC ACID 20 MEQ PO TBEF
20 | ORAL | Status: DC | PRN
Start: 2024-09-06 — End: 2024-09-06

## 2024-09-06 MED ORDER — NORMAL SALINE FLUSH 0.9 % IV SOLN
0.9 | INTRAVENOUS | Status: DC | PRN
Start: 2024-09-06 — End: 2024-09-06

## 2024-09-06 MED ORDER — ROSUVASTATIN CALCIUM 10 MG PO TABS
10 | Freq: Every evening | ORAL | Status: DC
Start: 2024-09-06 — End: 2024-09-06

## 2024-09-06 MED ORDER — SODIUM CHLORIDE 0.9 % IV BOLUS
0.9 | Freq: Once | INTRAVENOUS | Status: DC
Start: 2024-09-06 — End: 2024-09-05

## 2024-09-06 MED ORDER — POLYETHYLENE GLYCOL 3350 17 G PO PACK
17 | Freq: Every day | ORAL | Status: DC | PRN
Start: 2024-09-06 — End: 2024-09-06

## 2024-09-06 MED ORDER — SODIUM CHLORIDE 0.9 % IV SOLN
0.9 | INTRAVENOUS | Status: DC | PRN
Start: 2024-09-06 — End: 2024-09-06

## 2024-09-06 MED ORDER — AMLODIPINE BESYLATE 5 MG PO TABS
5 | ORAL_TABLET | Freq: Every day | ORAL | 3 refills | 90.00000 days | Status: AC
Start: 2024-09-06 — End: ?

## 2024-09-06 MED ORDER — ASPIRIN 325 MG PO TABS
325 | Freq: Once | ORAL | Status: AC
Start: 2024-09-06 — End: 2024-09-05
  Administered 2024-09-06: 03:00:00 325 mg via ORAL

## 2024-09-06 MED ORDER — MAGNESIUM SULFATE 2000 MG/50 ML IVPB PREMIX
2 | INTRAVENOUS | Status: DC | PRN
Start: 2024-09-06 — End: 2024-09-06

## 2024-09-06 MED ORDER — AMLODIPINE BESYLATE 5 MG PO TABS
5 | Freq: Every day | ORAL | Status: DC
Start: 2024-09-06 — End: 2024-09-06
  Administered 2024-09-06: 13:00:00 5 mg via ORAL

## 2024-09-06 MED FILL — ASPIRIN 325 MG PO TABS: 325 mg | ORAL | Qty: 1

## 2024-09-06 MED FILL — ELIQUIS 5 MG PO TABS: 5 mg | ORAL | Qty: 1 | Fill #0

## 2024-09-06 MED FILL — AMLODIPINE BESYLATE 5 MG PO TABS: 5 mg | ORAL | Qty: 1 | Fill #0

## 2024-09-06 MED FILL — METOPROLOL SUCCINATE ER 25 MG PO TB24: 25 mg | ORAL | Qty: 2

## 2024-09-06 MED FILL — NITROGLYCERIN 0.4 MG SL SUBL: 0.4 mg | SUBLINGUAL | Qty: 25

## 2024-09-06 MED FILL — ELIQUIS 5 MG PO TABS: 5 mg | ORAL | Qty: 1

## 2024-09-06 MED FILL — METOPROLOL SUCCINATE ER 50 MG PO TB24: 50 mg | ORAL | Qty: 1 | Fill #0

## 2024-09-06 NOTE — Plan of Care (Signed)
"    Problem: Discharge Planning  Goal: Discharge to home or other facility with appropriate resources  09/06/2024 0853 by Wynn Bars, RN  Outcome: Progressing  Flowsheets  Taken 09/06/2024 0745 by Wynn Bars, RN  Discharge to home or other facility with appropriate resources:   Identify barriers to discharge with patient and caregiver   Arrange for needed discharge resources and transportation as appropriate  Taken 09/06/2024 0532 by Daren Ruby, RN  Discharge to home or other facility with appropriate resources:   Identify barriers to discharge with patient and caregiver   Arrange for needed discharge resources and transportation as appropriate   Identify discharge learning needs (meds, wound care, etc)  09/06/2024 0531 by Daren Ruby, RN  Outcome: Progressing     "

## 2024-09-06 NOTE — ED Notes (Signed)
"  Nurse to nurse report given to 5th floor, no further questions at this time.   "

## 2024-09-06 NOTE — Telephone Encounter (Signed)
"  PLEASE ADVISE ON FOLLOW-UP FROM ER VISIT YESTERDAY   "

## 2024-09-06 NOTE — Progress Notes (Addendum)
 "Attending Attestation:       I independently interviewed and examined the patient. I have reviewed the above documentation completed by the APP. Please see my additional contributions to the HPI, physical exam, and assessment / medical decision making.    HPI, ROS, PMH, PSH, FMH, SH, and medications independently reviewed       History of Present Illness:   Jay Maxwell is a 53 y.o. male who presented on 09/05/2024 who has a past medical history significant for for hyperlipidemia, hypertension, obesity, atrial fibrillation on Eliquis , who presented to [facility] secondary to chest discomfort on arrival to the emergency department, the patient reported he has had some heaviness and tightness in the chest.  The patient had negative troponin x 2.  Chest x-ray was notable for no acute cardiopulmonary disease.  TSH was negative.  Lipid panel was notable for LDL 147.  Patient's respiratory panel was positive for rhinovirus.  Patient was seen at bedside, he admits to very mild congested feeling in his chest, this is constant, he has no associated shortness of breath, no palpitations.  He reports that nothing relieves or worsens his chest discomfort.    Review of Systems:   CV: Positive chest pain   Resp: No SOB  MSK: No leg swelling  Abdominal: No abdominal distension    Past Medical History:  Past Medical History:   Diagnosis Date    Arthritis of right acromioclavicular joint 01/04/2015    Diverticulitis     Hernia     Mixed hyperlipidemia 11/17/2016    Primary hypertension 03/18/2022    Restless leg        Past Surgical History:  Past Surgical History:   Procedure Laterality Date    ABDOMEN SURGERY      12 inches with colostomy with reversal    APPENDECTOMY      HERNIA REPAIR  2008    OTHER SURGICAL HISTORY  09/19/2014    laparoscopic incisional hernia repair with mesh    TONSILLECTOMY         Family History:  Family History   Problem Relation Age of Onset    Heart Disease Mother     High Blood Pressure Mother      Diabetes Mother     High Blood Pressure Father     Diabetes Father     Heart Disease Father        Social History:  Social History     Socioeconomic History    Marital status: Divorced     Spouse name: Not on file    Number of children: Not on file    Years of education: Not on file    Highest education level: Not on file   Occupational History    Not on file   Tobacco Use    Smoking status: Never    Smokeless tobacco: Never   Vaping Use    Vaping status: Never Used   Substance and Sexual Activity    Alcohol use: Never     Comment: social    Drug use: No    Sexual activity: Yes     Partners: Female   Other Topics Concern    Not on file   Social History Narrative    Not on file     Social Drivers of Health     Financial Resource Strain: Low Risk  (02/24/2022)    Overall Financial Resource Strain (CARDIA)     Difficulty of Paying Living Expenses: Not hard  at all   Food Insecurity: No Food Insecurity (09/06/2024)    Hunger Vital Sign     Worried About Running Out of Food in the Last Year: Never true     Ran Out of Food in the Last Year: Never true   Transportation Needs: No Transportation Needs (09/06/2024)    PRAPARE - Therapist, Art (Medical): No     Lack of Transportation (Non-Medical): No   Physical Activity: Not on file   Stress: Not on file   Social Connections: Not on file   Intimate Partner Violence: Not on file   Housing Stability: Low Risk  (09/06/2024)    Housing Stability Vital Sign     Unable to Pay for Housing in the Last Year: No     Number of Times Moved in the Last Year: 1     Homeless in the Last Year: No       Allergies:  No Known Allergies    Home Medications:  Prior to Admission medications   Medication Sig Start Date End Date Taking? Authorizing Provider   amLODIPine  (NORVASC ) 5 MG tablet TAKE 1 TABLET BY MOUTH EVERY DAY 09/04/24  Yes Hunt, Lamar BRAVO, MD   metoprolol  succinate (TOPROL  XL) 50 MG extended release tablet TAKE 1 TABLET BY MOUTH TWICE A DAY 08/30/24  Yes Hunt,  Lamar BRAVO, MD   ELIQUIS  5 MG TABS tablet TAKE 1 TABLET BY MOUTH TWICE A DAY 05/15/24  Yes Hunt, Lamar BRAVO, MD   ibuprofen  (ADVIL ;MOTRIN ) 600 MG tablet TAKE 1 TABLET BY MOUTH THREE TIMES A DAY AS NEEDED FOR PAIN 03/15/23  Yes Catterlin, Charlie Ruth, DO       Current Medications:  Current Facility-Administered Medications   Medication Dose Route Frequency Provider Last Rate Last Admin    nitroGLYCERIN  (NITROSTAT ) SL tablet 0.4 mg  0.4 mg SubLINGual Q5 Min PRN Svoboda, Michael, DO   0.4 mg at 09/05/24 2137    sodium chloride  0.9 % bolus 1,000 mL  1,000 mL IntraVENous Once Svoboda, Michael, DO        amLODIPine  (NORVASC ) tablet 5 mg  5 mg Oral Daily Husain, Ismail U, DO   5 mg at 09/06/24 0746    apixaban  (ELIQUIS ) tablet 5 mg  5 mg Oral BID Husain, Ismail U, DO   5 mg at 09/06/24 9253    metoprolol  succinate (TOPROL  XL) extended release tablet 50 mg  50 mg Oral BID Husain, Ismail U, DO   50 mg at 09/06/24 0746    glucose chewable tablet 16 g  4 tablet Oral PRN Husain, Ismail U, DO        dextrose  bolus 10% 125 mL  125 mL IntraVENous PRN Husain, Ismail U, DO        Or    dextrose  bolus 10% 250 mL  250 mL IntraVENous PRN Husain, Ismail U, DO        glucagon  injection 1 mg  1 mg SubCUTAneous PRN Husain, Ismail U, DO        dextrose  10 % infusion   IntraVENous Continuous PRN Husain, Ismail U, DO        sodium chloride  flush 0.9 % injection 5-40 mL  5-40 mL IntraVENous 2 times per day Ransom Acron U, DO   10 mL at 09/06/24 0747    sodium chloride  flush 0.9 % injection 5-40 mL  5-40 mL IntraVENous PRN Husain, Ismail U, DO        0.9 %  sodium chloride  infusion   IntraVENous PRN Husain, Ismail U, DO        potassium chloride  (KLOR-CON  M) extended release tablet 40 mEq  40 mEq Oral PRN Husain, Ismail U, DO        Or    potassium bicarb-citric acid  (EFFER-K) effervescent tablet 40 mEq  40 mEq Oral PRN Husain, Ismail U, DO        Or    potassium chloride  10 mEq/100 mL IVPB (Peripheral Line)  10 mEq IntraVENous PRN Husain, Ismail U,  DO        magnesium  sulfate 2000 mg in 50 mL IVPB premix  2,000 mg IntraVENous PRN Husain, Ismail U, DO        polyethylene glycol (GLYCOLAX ) packet 17 g  17 g Oral Daily PRN Husain, Ismail U, DO        acetaminophen  (TYLENOL ) tablet 650 mg  650 mg Oral Q6H PRN Husain, Ismail U, DO        Or    acetaminophen  (TYLENOL ) suppository 650 mg  650 mg Rectal Q6H PRN Husain, Ismail U, DO          dextrose       sodium chloride            Physical Exam:  BP (!) 132/98   Pulse 95   Temp 98 F (36.7 C) (Oral)   Resp 18   Ht 1.829 m (6')   Wt 131.5 kg (290 lb)   SpO2 98%   BMI 39.33 kg/m   Wt Readings from Last 3 Encounters:   09/05/24 131.5 kg (290 lb)   02/19/24 133.8 kg (295 lb)   11/04/23 (!) 136.6 kg (301 lb 3.2 oz)     Constitutional: Alert and oriented   Neck:  No  JVD present.   Cardiovascular: regular rate, irregular rhythm  Pulmonary/Chest:  No respiratory distress. No wheezes. No rales.   Abdominal: No distension.   Musculoskeletal:  edema   Skin: Skin is warm   Psychiatric: Normal mood and affect       Intake/Output:    Intake/Output Summary (Last 24 hours) at 09/06/2024 0838  Last data filed at 09/06/2024 9392  Gross per 24 hour   Intake 10 ml   Output --   Net 10 ml     No intake/output data recorded.  I/O last 3 completed shifts:  In: 10 [I.V.:10]  Out: -     Laboratory Tests:  Recent Labs     09/05/24  2045 09/06/24  0650   NA 139 141   K 3.8 3.9   CL 103 109*   CO2 24 22   BUN 12 13   CREATININE 1.0 1.0   GLUCOSE 205* 101*   CALCIUM  9.5 9.1     Lab Results   Component Value Date/Time    MG 2.1 09/06/2024 06:50 AM     Recent Labs     09/06/24  0650   ALKPHOS 51   ALT 41   AST 22   BILITOT 0.5     Recent Labs     09/05/24  2045 09/06/24  0650   WBC 13.4* 9.6   RBC 6.47* 5.77   HGB 19.0* 16.9*   HCT 56.1* 50.6   MCV 86.7 87.7   MCH 29.4 29.3   MCHC 33.9 33.4   RDW 14.1 14.3   PLT 298 235   MPV 9.2 9.2     No results found for: CKTOTAL, CKMB, CKMBINDEX, TROPONINI  Recent  Labs     09/05/24  2045  09/05/24  2131   TROPHS 8 7     Lab Results   Component Value Date    INR 1.1 12/14/2015    PROTIME 12.7 (H) 12/14/2015     Lab Results   Component Value Date    TSH 0.65 09/06/2024    T4FREE 1.4 09/06/2024      Lab Results   Component Value Date    LABA1C 6.1 (H) 02/25/2022     No results found for: EAG  Lab Results   Component Value Date    CHOL 196 09/06/2024    CHOL 170 02/25/2022    CHOL 155 07/26/2019     Lab Results   Component Value Date    TRIG 97 09/06/2024    TRIG 133 02/25/2022    TRIG 73 07/26/2019     Lab Results   Component Value Date    HDL 30 (L) 09/06/2024    HDL 36 02/25/2022    HDL 42 07/26/2019     No components found for: LDLCALC, LDLCHOLESTEROL  Lab Results   Component Value Date    VLDL 19 09/06/2024    VLDL 27 02/25/2022    VLDL 15 07/26/2019     No results found for: CHOLHDLRATIO  No results for input(s): PROBNP in the last 72 hours.  No results found for: CRP  No results found for: SEDRATE    Cardiac Tests:  EKG personally reviewed YES    Telemetry personally reviewed (date: 09/06/2024): Rate controlled AF    Echocardiogram reviewed: N/A repeat ordered    Stress test personally reviewed/Cardiac CT report reviewed:  N/A    Cardiac catheterization report reviewed: N/A    CXR report reviewed: YES    The 10-year ASCVD risk score (Arnett DK, et al., 2019) is: 9.7%    Values used to calculate the score:      Age: 89 years      Clinically relevant sex: Male      Is Non-Hispanic African American: No      Diabetic: No      Tobacco smoker: No      Systolic Blood Pressure: 132 mmHg      Is BP treated: Yes      HDL Cholesterol: 30 mg/dL      Total Cholesterol: 196 mg/dL    ASSESSMENT / PLAN:   Atrial fibrillation atrial fibrillation -patient is status post cardioversion last year.  No recent transthoracic echocardiogram.  Will repeat here.  Continue Eliquis , continue metoprolol   Continue outpatient follow-up with Dr. Perley  Chest discomfort -likely secondary to his current viral illness.   However, the patient has had no previous ischemic evaluation, no evidence of myocardial infarction and he does have a current respiratory infection.  Would recpmmend ischemic evaluation, shared decision-making was performed the patient, he elected for an outpatient ischemic evaluation.  No evidence of acute myocardial infarction, shared decision making performed, outpatient ischemic evaluation elected by patient.   Will check echocardiogram for ejection fraction here  Hypertension   Currently his blood pressure is 132/98 however he is not received all his blood pressure medications yet.  Continue his Norvasc  - will up titrate to 10 mg.   4.   Hyperlipidemia.   Would recommend statin, ASCVD is 9.7%,would start Crestor  10 mg daily.  Ordered.     5. Aortic aneurysm - noted on echo today - cont. Follow up outpatient. I did send a message to Dr. Larose  coordinator for follow up.     Thank you for allowing me to participate in your patient's care. Please feel free to contact me if you have any questions or concerns.    -----------------------------------------------------------------------------------------------------------------------------------------------  CARDIOLOGY ATTENDING ATTESTATION (DR. Nikolos Billig)  I spent > 51% of the total time involved with completing the encounter. The total time included the following:  Independently interviewing the patient (HPI, ROS, PMH, PSH, FMH, SH, allergies, and medications)  Independently performing a medically appropriate examination  Reviewing the above documentation completed by the APP  Ordering medications, tests, and/or procedures  Formulating the assessment/plan and reviewing the rationale for the above recommendations  Reviewing available records, results of all previously ordered testing/procedures, and current problem list  Counseling/educating the patient  Coordinating care with other healthcare professionals  Communicating results to the patient's  family/caregiver  Documenting clinical information in the patient's electronic health record  -----------------------------------------------------------------------------------------------------------------------------------------------      NOTE: This report was transcribed using voice recognition software. Every effort was made to ensure accuracy; however, inadvertent computerized transcription errors may be present.  Donnice Camper, DO, [facility]  [facility]    "

## 2024-09-06 NOTE — Telephone Encounter (Signed)
"  Care Transitions Initial Follow Up Call    Patient: Jay Maxwell Patient DOB: Jul 28, 1971   MRN: [id number]  Reason for Admission: chest pain  Discharge Date: 02/19/24      Discharge department/facility: [facility]     Outreach made within 2 business days of discharge: Yes, spoke with patient or caregiver     [facility] Interactive Patient Contact:  Was patient able to fill all prescriptions: Yes  Was patient instructed to bring all medications to the follow-up visit: Yes  Is patient taking all medications as directed in the discharge summary? Yes  Does patient understand their discharge instructions: Yes  Does patient have questions or concerns that need addressed prior to 0-14 day follow up office visit: No    Additional needs identified to be addressed with provider  No needs identified           .    Scheduled appointment with PCP within 0-14 days    Follow Up  Future Appointments   Date Time Provider Department Center   09/11/2024  9:30 AM Caleb Meade SQUIBB, DO [facility]   11/09/2024  7:30 AM Hunt, Lamar BRAVO, MD [facility]       [place], [place]  "

## 2024-09-06 NOTE — Care Coordination (Signed)
"  09/06/24 1306 CM note: (+) rhinovirus 09/06/24. Pt presented with elevated BP and chest pain. Hx HTN, afib- on eliquis . Room air. Discharge order noted. Po norvasc  and crestor  sent to [facility] in [place]. Met with patient to discuss coordination of care needs. Pt resides with his 3 sons ina 2 story home. Pt states he is independent with ADLs, works, drives, and has no DME. No hx HHC or SNF, but hx outpt therapy. Discharge plan is home and pt declines HHC or any other needs. Pts vehicle is in the parking lot and he plans to drive himself home. Electronically signed by Jon Gab, RN on 09/06/2024 at 1:48 PM      Update: 09/06/24 1351 Per [facility], prior auth is needed for rosuvastatin . Called [phone] and completed prior auth- awaiting response. Electronically signed by Jon Gab, RN on 09/06/2024 at 1:53 PM   "

## 2024-09-06 NOTE — Plan of Care (Signed)
"    Problem: Discharge Planning  Goal: Discharge to home or other facility with appropriate resources  Outcome: Progressing     Problem: ABCDS Injury Assessment  Goal: Absence of physical injury  Outcome: Progressing     "

## 2024-09-06 NOTE — Consults (Signed)
 "      Inpatient Cardiology Consultation      Reason for Consult: Chest pain    Consulting Physician: Dr. Hilliard    Requesting Physician: Dr. Ransom    Date of Consultation: 09/06/2024    HISTORY OF PRESENT ILLNESS: 53 y.o. Caucasian male, known to Dr. Perley, last seen 11/04/2023 as outpatient for follow-up for persistent atrial fibrillation, asymptomatic, compliant with NOAC and BP, advised to continue with current medications, and follow-up office visit in 1 year.    09/05/2024: Presented to [facility] ED for evaluation of 3-day history of elevated blood pressure and chest pain, noting that he has been out of his blood pressure medications for several days, including amlodipine  and metoprolol .  1 day prior, while at work, he began to feel flushed, lightheaded, nauseated, and diaphoresis went back home, started experiencing chest pressure and tightness, checked his BP, 151/110, contacted the cardiology office, and was advised to report to the ED for further evaluation.    Patient lying in bed, alert and oriented x 3, no obvious signs of distress.  He tells me that, Monday, at work, he felt lightheaded, like he was get a pass out, got really hot, and sweaty and went home.  When he got home, checked his BP, it was elevated, and developed midsternal chest pain, 3-4/10 on pain scale, described as sharp, lasting approximate 20 minutes, without any specific exacerbating factors, but notes that he sat and rested and symptoms did improve.  He called cardiology office, was recommended to report to the ED for further evaluation.  Prior to arrival, symptoms did improve, but notes that he developed constant midsternal chest pressure/tightness overnight some time, 1-2/10 on pain scale, without any exacerbating or alleviating factors, but he notes that it does get slightly more noticeable with deep breaths.  He has had some nausea, 1 episode of vomiting, soft stools/diarrhea, headache, and occasional palpitation.    Of note,  patient reports that he recently changed around his Eliquis  timing, to correlate with his work schedule, and did miss a dose of his Eliquis  1 day.  Denies any other recent medication changes, reports he takes all his medications as prescribed, and has not missed any other doses of any other medications.    He denies any other associated symptoms, including shortness of breath, neck pain, jaw pain, arm pain, numbness, tingling, abdominal pain, fever, or chills.  He denies any recent sickness, sick exposure, dietary changes, or recent travel, foreign or domestic.    He does not require any assistive devices to help with ambulation.  Lives with his 3 sons, two-story house, denies any troubles going up or down steps.  Sleeps in a bed, with 1-2 pillows, for comfort reasons only, denies any history of OSA, orthopnea, or PND.     [facility] 09/05/2024: BP 125/86, HR 94, afebrile, O2 sat 95% on RA     Troponin 8>7 (<6 on 07/22/2023), K+ 3.8, BUN/CR 12/1.0, WBC 13.4>9.6, H/H 19.0/56.1>16.9/50.6, PLT 298, TSH 0.65, T4 1.4, Mag 2.1, Phos 3.9, respiratory panel positive for rhinovirus/enterovirus PCR    CXR 09/05/2024:  No acute process.     ED medications: Eliquis  5 mg, ASA 325 mg, Toprol  50 mg, nitro SL x 1, NS 1L IV bolus     Current VS: BP  135/98   Pulse 100   continued afebrile  Resp 20   SpO2 96% on RA  Ht 1.829 m (6')   Wt 131.5 kg (290 lb)   BMI 39.33  Please note: past medical records were reviewed per electronic medical record (EMR) - see detailed reports under Past Medical/ Surgical History.   Past Medical History:    Persistent A-fib -Eliquis   CHA2DS2-VASc score at least 1 (HTN)  S/p DCCV from atrial fibrillation to NSR, 04/22/2022 did not follow-up and stopped his medications, back in A-fib 01/14/2023  03/30/2023 TEE (Dr. Charis): EF >50%.  Left atrium was dilated, no left atrial appendage thrombus.  No evidence of right-to-left intra-atrial shunting on bubble study.  Mild MR.  Mild TR.  Successful DCCV to NSR  on second attempt.  Office visit on 11/04/2023, EKG noted atrial fibrillation, CRP, HR 83 bpm, discussion about rate control VS rhythm control, and elected rate control.  HTN  HLD - not on statin  Prediabetes - 02/25/2022 HgbA1c 6.1%  Diverticulosis with history of diverticulitis  Status post bowel resection after perforated diverticuli in 2006, with colostomy placement - colostomy reversal in 2009 with hernia surgery  Hx of abdominal hernia - s/p repair in 2009, and 2011  Arthritis  Restless leg syndrome  Lifelong non-smoker    Past Surgical History:    Past Surgical History:   Procedure Laterality Date    ABDOMEN SURGERY      12 inches with colostomy with reversal    APPENDECTOMY      HERNIA REPAIR  2008    OTHER SURGICAL HISTORY  09/19/2014    laparoscopic incisional hernia repair with mesh    TONSILLECTOMY       Medications Prior to admit:  Prior to Admission medications   Medication Sig Start Date End Date Taking? Authorizing Provider   amLODIPine  (NORVASC ) 5 MG tablet TAKE 1 TABLET BY MOUTH EVERY DAY 09/04/24  Yes Hunt, Lamar BRAVO, MD   metoprolol  succinate (TOPROL  XL) 50 MG extended release tablet TAKE 1 TABLET BY MOUTH TWICE A DAY 08/30/24  Yes Hunt, Lamar BRAVO, MD   ELIQUIS  5 MG TABS tablet TAKE 1 TABLET BY MOUTH TWICE A DAY 05/15/24  Yes Hunt, Lamar BRAVO, MD   ibuprofen  (ADVIL ;MOTRIN ) 600 MG tablet TAKE 1 TABLET BY MOUTH THREE TIMES A DAY AS NEEDED FOR PAIN 03/15/23  Yes Catterlin, Charlie Ruth, DO     Current Medications:    Current Facility-Administered Medications: nitroGLYCERIN  (NITROSTAT ) SL tablet 0.4 mg, 0.4 mg, SubLINGual, Q5 Min PRN  sodium chloride  0.9 % bolus 1,000 mL, 1,000 mL, IntraVENous, Once  amLODIPine  (NORVASC ) tablet 5 mg, 5 mg, Oral, Daily  apixaban  (ELIQUIS ) tablet 5 mg, 5 mg, Oral, BID  metoprolol  succinate (TOPROL  XL) extended release tablet 50 mg, 50 mg, Oral, BID  glucose chewable tablet 16 g, 4 tablet, Oral, PRN  dextrose  bolus 10% 125 mL, 125 mL, IntraVENous, PRN **OR** dextrose  bolus 10% 250  mL, 250 mL, IntraVENous, PRN  glucagon  injection 1 mg, 1 mg, SubCUTAneous, PRN  dextrose  10 % infusion, , IntraVENous, Continuous PRN  sodium chloride  flush 0.9 % injection 5-40 mL, 5-40 mL, IntraVENous, 2 times per day  sodium chloride  flush 0.9 % injection 5-40 mL, 5-40 mL, IntraVENous, PRN  0.9 % sodium chloride  infusion, , IntraVENous, PRN  potassium chloride  (KLOR-CON  M) extended release tablet 40 mEq, 40 mEq, Oral, PRN **OR** potassium bicarb-citric acid  (EFFER-K) effervescent tablet 40 mEq, 40 mEq, Oral, PRN **OR** potassium chloride  10 mEq/100 mL IVPB (Peripheral Line), 10 mEq, IntraVENous, PRN  magnesium  sulfate 2000 mg in 50 mL IVPB premix, 2,000 mg, IntraVENous, PRN  polyethylene glycol (GLYCOLAX ) packet 17 g, 17 g, Oral, Daily PRN  acetaminophen  (TYLENOL ) tablet 650 mg, 650 mg, Oral, Q6H PRN **OR** acetaminophen  (TYLENOL ) suppository 650 mg, 650 mg, Rectal, Q6H PRN    Allergies:  Patient reports NKDA    Social History:    Tobacco: Lifelong non-smoker  ETOH: Denied  Illicit drugs: 10  Activity: He does not require any assistive devices to help with ambulation.  Lives with his 3 sons, two-story house, denies any troubles going up or down steps.     Code Status: Full code    Family History: Denies any family history of early onset CAD or SCD.    Family History   Problem Relation Age of Onset    Heart Disease Mother     High Blood Pressure Mother     Diabetes Mother     High Blood Pressure Father     Diabetes Father     Heart Disease Father      REVIEW OF SYSTEMS:   See HPI    PHYSICAL EXAM:   BP (!) 135/98   Pulse 100   Temp 98.2 F (36.8 C) (Oral)   Resp 20   Ht 1.829 m (6')   Wt 131.5 kg (290 lb)   SpO2 96%   BMI 39.33 kg/m     CONST:  Well developed, well nourished, obese, Caucasian male, who appears of stated age. Awake, alert and cooperative. No apparent distress.   HEENT:   Head- Normocephalic, atraumatic   Eyes- Conjunctivae pink, anicteric  Throat- Oral mucosa pink and moist  Neck-  No  stridor, trachea midline, no jugular venous distention. No carotid bruit.    CHEST: Chest symmetrical and non-tender to palpation. No accessory muscle use or intercostal retractions  RESPIRATORY: Lung sounds -diminished throughout fields, Rales to bilateral lower lobes.  CARDIOVASCULAR:     Heart Ausculation- Irregular rhythm, controlled rate, no murmur. No s3, s4 or rub   PV: No lower extremity edema. No varicosities. Pedal pulses palpable, no clubbing or cyanosis   ABDOMEN: Softly distended, non-tender to light palpation. Bowel sounds present. No palpable masses no organomegaly; no abdominal bruit  MS: Good muscle strength and tone. No atrophy or abnormal movements.   GU: Deferred  SKIN: Warm and dry no statis dermatitis or ulcers.  Multiple scabbed areas to abdomen  NEURO / PSYCH: Oriented to person, place and time. Speech clear and appropriate. Follows all commands. Pleasant affect     Wt Readings from Last 3 Encounters:   09/05/24 131.5 kg (290 lb)   02/19/24 133.8 kg (295 lb)   11/04/23 (!) 136.6 kg (301 lb 3.2 oz)     DATA:    ECG 09/05/2024 20:44: Atrial fibrillation, CVR, HR 99 bpm    Tele strips: A-fib, CVR, HR 80-90s    Diagnostic:  CXR 09/05/2024:  No acute process.      Intake/Output Summary (Last 24 hours) at 09/06/2024 0717  Last data filed at 09/06/2024 9392  Gross per 24 hour   Intake 10 ml   Output --   Net 10 ml     Labs:   CBC:   Recent Labs     09/05/24  2045 09/06/24  0650   WBC 13.4* 9.6   HGB 19.0* 16.9*   HCT 56.1* 50.6   PLT 298 235     BMP:   Recent Labs     09/05/24  2045   NA 139   K 3.8   CO2 24   BUN 12   CREATININE 1.0   LABGLOM 86  CALCIUM  9.5     TFT:   Lab Results   Component Value Date    TSH 1.100 02/25/2022    TSH 1.750 07/26/2019      HgA1c:   Lab Results   Component Value Date/Time    LABA1C 6.1 02/25/2022 01:48 PM    LABA1C 5.4 07/26/2019 09:03 AM    LABA1C 5.5 01/10/2018 12:00 PM     TROPONIN:  Lab Results   Component Value Date/Time    TROPHS 7 09/05/2024 09:31 PM     TROPHS 8 09/05/2024 08:45 PM    TROPHS <6 07/22/2023 07:05 PM     FASTING LIPID PANEL:  Lab Results   Component Value Date/Time    CHOL 170 02/25/2022 01:48 PM    CHOL 155 07/26/2019 09:03 AM    CHOL 187 01/10/2018 12:00 PM    HDL 36 02/25/2022 01:48 PM    HDL 42 07/26/2019 09:03 AM    HDL 43 01/10/2018 12:00 PM    LDL 107 02/25/2022 01:48 PM    LDL 98 07/26/2019 09:03 AM    LDL 129 01/10/2018 12:00 PM    TRIG 133 02/25/2022 01:48 PM    TRIG 73 07/26/2019 09:03 AM    TRIG 75 01/10/2018 12:00 PM     COVID19:   Recent Labs     09/06/24  0036   COVID19 Not Detected     Assessment and Plan per Dr. Sims:    Electronically signed by Elsie JINNY Gosling, APRN - CNP on 09/06/2024 at 7:17 AM     "

## 2024-09-06 NOTE — Significant Event (Signed)
"  Echocardiogram results EF 55 to 60%.  Will discharge home with increased amlodipine  and started on Crestor .  Discharge instructions reviewed with the patient over the telephone patient instructed follow-up with primary care physician and cardiology.  "

## 2024-09-06 NOTE — Discharge Summary (Signed)
 "            [facility]                [address] [place], [place] [address]                            DISCHARGE SUMMARY      PATIENT NAME: Jay Maxwell, Jay Maxwell              DOB: December 10, 1970  MED REC NO: [id number]                        ROOM: 0521  ACCOUNT NO: [id number]                       ADMIT DATE: 09/05/2024  PROVIDER: Lamar FORBES Delay, DO      PRIMARY CARE PHYSICIAN:  Charlie Jama Haller, DO    ADMITTING DIAGNOSES:  Chest pain.  Acute viral illness of Rhinovirus.    FINAL DISCHARGE DIAGNOSES:  Chest pain with troponin being normal with plan for outpatient stress test.  Persistent atrial fibrillation, on anticoagulant therapy.    SECONDARY DISCHARGE DIAGNOSES:  Chronic obstructive pulmonary disease, hyperlipidemia, essential hypertensive, restless legs syndrome, obesity with elevated BMI of 39.35, suspected sleep apnea.    COMPLICATIONS:  No complications.    OPERATIONS:  No operations.    CONSULTATIONS OBTAINED:  With [facility].  No OMT was given.    ADDITIONAL ADMITTING PHYSICIAN:  Dr. Netta.    CHIEF COMPLAINT AND HISTORY OF CHIEF COMPLAINT:  Pleasant 53 year old white male who was admitted to [facility] [place]. The patient presented to the hospital here on September 05, 2024.  The patient presented to the hospital here through the emergency room with chief complaint of chest pain.  The patient states the pain started yesterday, first episode.  The patient states he was out working when he began to become lightheaded.  The patient left work and went back home, at which time he experienced some sort of chest pain.  The patient states it was a pressure sensation.  He presented to the emergency room here, was seen and evaluated.  He was subsequently admitted to the hospital to the general telemetry floor, admitted under the service Dr. Delay and Dr. Netta.    PAST MEDICAL HISTORY:  Positive for usual childhood diseases, history of arthritis of the right acromioclavicular joint,  diverticulitis, hernia, history of hyperlipidemia, hypertension, restless legs syndrome.    PHYSICAL EXAMINATION:  VITAL SIGNS:  Stable.  Blood pressure 127/88, pulse 100, O2 saturation 96%.  GENERAL APPEARANCE:     53 year old white male who is alert, responsive, oriented to person, place, and time.  HEENT:  Normal.  NECK:  Short stature.  Good carotid upstroke.  No JVD.  Trachea midline.  Thyroid not palpable.  LUNGS:  Clear.  HEART:  Fairly regular.  ABDOMEN:  Obese.   EXTREMITIES:  Without edema.    LABORATORY DATA:  While the patient was in the hospital, he had following laboratory studies performed.  Hemoglobin and hematocrit 19.0 and 56.1 respectively, white count 13,000, platelet count adequate.  BUN and creatinine were 12 and 1.0 respectively.  Electrolytes were stable.    HOSPITAL COURSE:  The patient was admitted to the hospital here under the service of Dr. Delay and Dr. Netta.  The patient was seen and evaluated at  that time.  The patient's initial troponin level at that time was negative.  He was continued on the medication which he was taking at home.  The patient did have what appeared to be cold-like symptom.  He did test positive for Rhinovirus.  We proceeded with further workup at that time.  For the most part, he was seen by [facility].  The patient's at that time had adjustment made in medication.  Echocardiogram was performed.  Echocardiogram did show good LV function, at which time he was felt stable enough to be discharged home, and follow up with his primary cardiologist.  His echocardiogram at that time did show an EF of 55% to 60%.  Moderate concentric hypertrophy was noted.  Remaining lab work at this time has been satisfactory.  The patient was subsequently discharged home at this time.  His blood pressure at the time of discharge 140/98, pulse 75, respirations 20, O2 saturation was 96%.  Height 6 feet, weight 290 pounds.  The patient was discharged on Crestor  10 mg daily,  amlodipine  was increased to 5 mg b.i.d.  The patient will continue Eliquis  5 b.i.d., Motrin  600 b.i.d. p.r.n., and metoprolol  succinate 50 mg b.i.d.  The patient will follow up with his primary care doctor, Dr. Charlie Haller, in 1 week.  The patient will follow up with his [facility] cardiologist.  The patient otherwise at this time has been stable.  We will proceed with further workup as clinically indicated.  The patient at this time will follow up with Cardiology, Dr. Perley.  He also will follow up with his primary care doctor as indicated.  The patient is recommended to continue his medication.  The patient was instructed to return if any problems arise.  Conservative treatment for the Rhinovirus is recommended.  The patient will have an outpatient sleep study performed.  More than 40 minutes was spent on the day of discharge with more than 50% of time spent face-to-face with the patient discussing plan of care and treatment.          LAMAR FORBES DELAY, DO      D:  09/06/2024 14:36:03     T:  09/06/2024 15:10:50     REB/AQS  Job #:  [id number]     Doc#:  [id number]     "

## 2024-09-06 NOTE — Discharge Summary (Signed)
"  Dictate [phone]  "

## 2024-09-07 LAB — EKG 12-LEAD
Atrial Rate: 64 {beats}/min
Atrial Rate: 113 {beats}/min
Q-T Interval: 318 ms
Q-T Interval: 328 ms
QRS Duration: 78 ms
QRS Duration: 72 ms
QTc Calculation (Bazett): 408 ms
QTc Calculation (Bazett): 447 ms
R Axis: -10 degrees
R Axis: 9 degrees
T Axis: -19 degrees
T Axis: 6 degrees
Ventricular Rate: 99 {beats}/min
Ventricular Rate: 112 {beats}/min

## 2024-09-11 ENCOUNTER — Ambulatory Visit
Admit: 2024-09-11 | Discharge: 2024-09-11 | Payer: BLUE CROSS/BLUE SHIELD | Attending: Family Medicine | Primary: Family Medicine

## 2024-09-11 DIAGNOSIS — I4819 Other persistent atrial fibrillation: Principal | ICD-10-CM

## 2024-09-11 MED ORDER — ROSUVASTATIN CALCIUM 10 MG PO TABS
10 | ORAL_TABLET | Freq: Every evening | ORAL | 3 refills | 90.00000 days | Status: AC
Start: 2024-09-11 — End: ?

## 2024-09-15 NOTE — Progress Notes (Signed)
 " Post-Discharge Transitional Care  Follow Up      Jay Maxwell   Date of Birth: Mar 19, 1971    Date of Office Visit: 09/11/2024  Date of Hospital Admission: 09/05/24  Date of Hospital Discharge: 09/06/24  Risk of hospital readmission (high >=14%. Medium >=10%): Readmission Risk Score: 7.6      Care management risk score Rising risk (score 2-5) and Complex Care (Scores >=6): No Risk Score On File     Non face to face following discharge, date last encounter closed (first attempt may have been earlier): 09/06/2024    Call initiated 2 business days of discharge: Yes, completed    ASSESSMENT/PLAN:   Persistent atrial fibrillation (HCC)  Stable. Eliquis .   Primary hypertension  -     rosuvastatin  (CRESTOR ) 10 MG tablet; Take 1 tablet by mouth nightly, Disp-30 tablet, R-3Normal  Controlled. BB, statin, low salt diet.   Mixed hyperlipidemia  -     rosuvastatin  (CRESTOR ) 10 MG tablet; Take 1 tablet by mouth nightly, Disp-30 tablet, R-3Normal  Sleep apnea, unspecified type  -     [place] - Renelda Pac, MD, Sleep Medicine, [place]  Chest pain, unspecified type  Resolved.     Pt instructed if any worse go ED ASAP.    Medical Decision Making: moderate complexity  Return in about 3 months (around 12/12/2024).    On this date 09/11/2024 I have spent 40 minutes reviewing previous notes, test results and face to face with the patient discussing the diagnosis and importance of compliance with the treatment plan as well as documenting on the day of the visit.       SUBJECTIVE:   HPI: Follow up of Hospital problems/diagnosis(es): chest pain, lightheadedness, atrial fib (HCC), hypertension, rhinovirus    Inpatient course: Discharge summary reviewed- see chart.    Interval history/Current status: good     Patient Active Problem List   Diagnosis    Restless leg    Mixed hyperlipidemia    Primary hypertension    Atrial fibrillation (HCC)    Morbid obesity (HCC)    Chest pain       Medications listed as ordered at the time of discharge  from hospital     Medication List            Accurate as of September 11, 2024 11:59 PM. If you have any questions, ask your nurse or doctor.                CHANGE how you take these medications      ibuprofen  600 MG tablet  Commonly known as: ADVIL ;MOTRIN   TAKE 1 TABLET BY MOUTH THREE TIMES A DAY AS NEEDED FOR PAIN  What changed: See the new instructions.     rosuvastatin  10 MG tablet  Commonly known as: CRESTOR   Take 1 tablet by mouth nightly  What changed: additional instructions            CONTINUE taking these medications      amLODIPine  5 MG tablet  Commonly known as: NORVASC   Take 2 tablets by mouth daily     Eliquis  5 MG Tabs tablet  Generic drug: apixaban   TAKE 1 TABLET BY MOUTH TWICE A DAY     metoprolol  succinate 50 MG extended release tablet  Commonly known as: TOPROL  XL  TAKE 1 TABLET BY MOUTH TWICE A DAY               Where to Get Your Medications  These medications were sent to [facility] [address], [place] - [address] - P [phone] [facility] [phone]  [address] [place], [place] [address]      Phone: [phone]   rosuvastatin  10 MG tablet           Medications marked taking at this time  Outpatient Medications Marked as Taking for the 09/11/24 encounter (Office Visit) with [name], DO   Medication Sig Dispense Refill    rosuvastatin  (CRESTOR ) 10 MG tablet Take 1 tablet by mouth nightly 30 tablet 3    amLODIPine  (NORVASC ) 5 MG tablet Take 2 tablets by mouth daily 90 tablet 3    metoprolol  succinate (TOPROL  XL) 50 MG extended release tablet TAKE 1 TABLET BY MOUTH TWICE A DAY 180 tablet 1    ELIQUIS  5 MG TABS tablet TAKE 1 TABLET BY MOUTH TWICE A DAY 60 tablet 5    ibuprofen  (ADVIL ;MOTRIN ) 600 MG tablet TAKE 1 TABLET BY MOUTH THREE TIMES A DAY AS NEEDED FOR PAIN (Patient taking differently: PRN) 90 tablet 0        Medications patient taking as of now reconciled against medications ordered at time of hospital discharge: Yes    A comprehensive review of systems was negative except  for what was noted in the HPI.    OBJECTIVE:       Vitals:    09/11/24 0932   BP: 128/80   BP Site: Right Upper Arm   Patient Position: Sitting   Pulse: (!) 106   Resp: 18   Temp: 97 F (36.1 C)   TempSrc: Temporal   SpO2: 96%   Weight: 132.5 kg (292 lb)   Height: 1.829 m (6' 0.01)      Body mass index is 39.59 kg/m.       General Appearance: alert and oriented to person, place and time, well-developed and well-nourished, in no acute distress and Patient has morbid obesity. Patient instructed on low calorie, healthy diet.   Skin: warm and dry, no rash or erythema  Head: normocephalic and atraumatic  Eyes: pupils equal, round, and reactive to light, extraocular eye movements intact, conjunctivae normal  ENT: tympanic membrane, external ear and ear canal normal bilaterally, oropharynx clear and moist with normal mucous membranes  Neck: neck supple and non tender without mass, no thyromegaly or thyroid nodules, no cervical lymphadenopathy   Pulmonary/Chest: clear to auscultation bilaterally- no wheezes, rales or rhonchi, normal air movement, no respiratory distress  Cardiovascular: normal rate  Abdomen: soft, non-tender, non-distended, normal bowel sounds, no masses or organomegaly  Extremities: no cyanosis and no clubbing  Musculoskeletal: Pain and decreased ROM multiple joints.  Neurologic: gait and coordination normal and speech normal      An electronic signature was used to authenticate this note.  --Bertie Mcconathy P Lititia Sen, DO        "

## 2024-11-16 ENCOUNTER — Encounter: Payer: BLUE CROSS/BLUE SHIELD | Attending: Cardiovascular Disease | Primary: Family Medicine

## 2024-11-26 ENCOUNTER — Encounter

## 2024-11-27 MED ORDER — ELIQUIS 5 MG PO TABS
5 | ORAL_TABLET | Freq: Two times a day (BID) | ORAL | 1 refills | 30.00000 days | Status: AC
Start: 2024-11-27 — End: ?
